# Patient Record
Sex: Female | Born: 1960 | Race: Black or African American | Hispanic: No | Marital: Single | Smoking: Current every day smoker
Health system: Southern US, Community
[De-identification: ages and names within clinical notes are randomized; demographics above are authoritative.]

## PROBLEM LIST (undated history)

## (undated) DIAGNOSIS — I10 Essential (primary) hypertension: Secondary | ICD-10-CM

## (undated) DIAGNOSIS — R51 Headache: Secondary | ICD-10-CM

## (undated) HISTORY — PX: TONSILLECTOMY: SUR1361

---

## 2011-09-21 ENCOUNTER — Encounter: Payer: Self-pay | Admitting: Emergency Medicine

## 2011-09-21 ENCOUNTER — Other Ambulatory Visit: Payer: Self-pay

## 2011-09-21 ENCOUNTER — Emergency Department (INDEPENDENT_AMBULATORY_CARE_PROVIDER_SITE_OTHER): Payer: Self-pay

## 2011-09-21 ENCOUNTER — Observation Stay (HOSPITAL_BASED_OUTPATIENT_CLINIC_OR_DEPARTMENT_OTHER)
Admission: EM | Admit: 2011-09-21 | Discharge: 2011-09-23 | Disposition: A | Discharge status: Home / Self Care | Payer: Self-pay | Attending: Internal Medicine | Admitting: Internal Medicine

## 2011-09-21 DIAGNOSIS — Z23 Encounter for immunization: Secondary | ICD-10-CM | POA: Insufficient documentation

## 2011-09-21 DIAGNOSIS — E785 Hyperlipidemia, unspecified: Secondary | ICD-10-CM | POA: Insufficient documentation

## 2011-09-21 DIAGNOSIS — I161 Hypertensive emergency: Secondary | ICD-10-CM

## 2011-09-21 DIAGNOSIS — F17200 Nicotine dependence, unspecified, uncomplicated: Secondary | ICD-10-CM | POA: Insufficient documentation

## 2011-09-21 DIAGNOSIS — I10 Essential (primary) hypertension: Principal | ICD-10-CM | POA: Insufficient documentation

## 2011-09-21 DIAGNOSIS — Z720 Tobacco use: Secondary | ICD-10-CM | POA: Diagnosis present

## 2011-09-21 HISTORY — DX: Headache: R51

## 2011-09-21 LAB — CBC
HCT: 43.6 % (ref 36.0–46.0)
Hemoglobin: 14.2 g/dL (ref 12.0–15.0)
WBC: 6.8 10*3/uL (ref 4.0–10.5)

## 2011-09-21 LAB — URINE MICROSCOPIC-ADD ON

## 2011-09-21 LAB — URINALYSIS, ROUTINE W REFLEX MICROSCOPIC
Glucose, UA: NEGATIVE mg/dL
Hgb urine dipstick: NEGATIVE
Specific Gravity, Urine: 1.007 (ref 1.005–1.030)
pH: 7 (ref 5.0–8.0)

## 2011-09-21 LAB — BASIC METABOLIC PANEL
BUN: 11 mg/dL (ref 6–23)
Chloride: 107 mEq/L (ref 96–112)
GFR calc Af Amer: >90 mL/min (ref >90)
Potassium: 3.8 mEq/L (ref 3.5–5.1)

## 2011-09-21 LAB — CARDIAC PANEL(CRET KIN+CKTOT+MB+TROPI): Troponin I: <0.3 ng/mL (ref <0.30)

## 2011-09-21 MED ORDER — LISINOPRIL 40 MG PO TABS
40.0000 mg | ORAL_TABLET | Freq: Every day | ORAL | Status: DC
  Administered 2011-09-22 – 2011-09-23 (×2): 40 mg via ORAL
  Filled 2011-09-21 (×2): qty 1

## 2011-09-21 MED ORDER — ENOXAPARIN SODIUM 40 MG/0.4ML ~~LOC~~ SOLN
40.0000 mg | SUBCUTANEOUS | Status: DC
  Administered 2011-09-22 – 2011-09-23 (×2): 40 mg via SUBCUTANEOUS
  Filled 2011-09-21 (×2): qty 0.4

## 2011-09-21 MED ORDER — ACETAMINOPHEN 650 MG RE SUPP: 650.0000 mg | Freq: Four times a day (QID) | RECTAL | Status: DC | PRN

## 2011-09-21 MED ORDER — ASPIRIN EC 81 MG PO TBEC
81.0000 mg | DELAYED_RELEASE_TABLET | Freq: Every day | ORAL | Status: DC
  Administered 2011-09-22 – 2011-09-23 (×2): 81 mg via ORAL
  Filled 2011-09-21 (×2): qty 1

## 2011-09-21 MED ORDER — LABETALOL HCL 5 MG/ML IV SOLN
10.0000 mg | INTRAVENOUS | Status: DC | PRN
  Filled 2011-09-21: qty 4

## 2011-09-21 MED ORDER — PNEUMOCOCCAL VAC POLYVALENT 25 MCG/0.5ML IJ INJ
0.5000 mL | INJECTION | INTRAMUSCULAR | Status: AC
  Administered 2011-09-22: 0.5 mL via INTRAMUSCULAR
  Filled 2011-09-21: qty 0.5

## 2011-09-21 MED ORDER — ACETAMINOPHEN 325 MG PO TABS
650.0000 mg | ORAL_TABLET | Freq: Four times a day (QID) | ORAL | Status: DC | PRN
  Administered 2011-09-22 – 2011-09-23 (×2): 650 mg via ORAL
  Filled 2011-09-21 (×2): qty 2

## 2011-09-21 MED ORDER — AMLODIPINE BESYLATE 10 MG PO TABS
10.0000 mg | ORAL_TABLET | Freq: Every day | ORAL | Status: DC
  Administered 2011-09-22 – 2011-09-23 (×2): 10 mg via ORAL
  Filled 2011-09-21 (×2): qty 1

## 2011-09-21 MED ORDER — ONDANSETRON HCL 4 MG PO TABS: 4.0000 mg | ORAL_TABLET | Freq: Four times a day (QID) | ORAL | Status: DC | PRN

## 2011-09-21 MED ORDER — SODIUM CHLORIDE 0.9 % IJ SOLN
3.0000 mL | Freq: Two times a day (BID) | INTRAMUSCULAR | Status: DC
  Administered 2011-09-22 (×2): 3 mL via INTRAVENOUS

## 2011-09-21 MED ORDER — ONDANSETRON HCL 4 MG/2ML IJ SOLN: 4.0000 mg | Freq: Four times a day (QID) | INTRAMUSCULAR | Status: DC | PRN

## 2011-09-21 MED ORDER — LABETALOL HCL 5 MG/ML IV SOLN
10.0000 mg | Freq: Once | INTRAVENOUS | Status: AC
  Administered 2011-09-21: 10 mg via INTRAVENOUS
  Filled 2011-09-21: qty 4

## 2011-09-21 MED ORDER — HYDRALAZINE HCL 20 MG/ML IJ SOLN
10.0000 mg | INTRAMUSCULAR | Status: AC
  Administered 2011-09-21: 10 mg via INTRAVENOUS
  Filled 2011-09-21: qty 1

## 2011-09-21 MED ORDER — SODIUM CHLORIDE 0.9 % IV SOLN: 250.0000 mL | INTRAVENOUS | Status: DC | PRN

## 2011-09-21 MED ORDER — SODIUM CHLORIDE 0.9 % IJ SOLN: 3.0000 mL | INTRAMUSCULAR | Status: DC | PRN

## 2011-09-21 NOTE — ED Notes (Signed)
Pt reports she had high BP at MD office yesterday (was there for a recheck of her foot); BP was 207/117; was not told to follow up, but she wanted to f/u today.

## 2011-09-21 NOTE — ED Provider Notes (Addendum)
History     CSN: [id number]  Arrival date & time 09/21/11  1032   First MD Initiated Contact with Patient 09/21/11 1111      No chief complaint on file. CC:  HTN  Pleasant, 51 year old female, who states she does not have a primary care Dr. She also denies any past medical history. She states she injured her foot at work 2 weeks ago. Was seen by an urgent care center. She was told her blood pressure was elevated at that time, but did not receive any prescriptions. Patient was seen again yesterday for a two-week followup of the foot injury and was again told her blood pressure was elevated. She was referred to see a primary care physician, but was not given any prescription for blood pressure medications. This morning. Patient states she got worried and wanted to be rechecked, so she came in to the ED for evaluation. Initial blood pressure is 250/136 and triaged. Patient is essentially asymptomatic. Denies headache or visual changes or any numbness, weakness or tingling. Denies any chest pain or shortness of breath. Denies any nausea, vomiting, or abdominal pain. Denies any back pain, or edema of the extremities. Patient does smoke but does not use any drugs, alcohol.  Apparently, hypertension does run in the family. She denies any recent hospital admissions  (Consider location/radiation/quality/duration/timing/severity/associated sxs/prior treatment) HPI  No past medical history on file.  No past surgical history on file.  No family history on file.  History  Substance Use Topics  . Smoking status: Not on file  . Smokeless tobacco: Not on file  . Alcohol Use: Not on file    OB History    No data available      Review of Systems  All other systems reviewed and are negative.    Allergies  Review of patient's allergies indicates not on file.  Home Medications  No current outpatient prescriptions on file.  BP 250/136  Pulse 102  Temp(Src) 98.6 F (37 C) (Oral)  Resp 18   Ht 5\' 3"  (1.6 m)  Wt 230 lb 9.6 oz (104.599 kg)  BMI 40.85 kg/m2  SpO2 99%  Physical Exam  Nursing note and vitals reviewed. Constitutional: She appears well-developed and well-nourished. No distress.  HENT:  Head: Normocephalic.  Eyes: Pupils are equal, round, and reactive to light.  Cardiovascular: Normal heart sounds.   Pulmonary/Chest: Breath sounds normal.  Abdominal: Soft.  Musculoskeletal: Normal range of motion. She exhibits no edema.  Neurological: She is alert.  Skin: Skin is warm and dry.    ED Course  Procedures (including critical care time)  Labs Reviewed - No data to display No results found.   No diagnosis found.    MDM  Pt is seen and examined;  Initial history and physical completed.  Will follow.          Amilyah Nack A. Patrica Duel, MD 09/22/11 1308  Lorelle Gibbs. Patrica Duel, MD 10/17/11 1102

## 2011-09-22 ENCOUNTER — Encounter (HOSPITAL_COMMUNITY): Payer: Self-pay

## 2011-09-22 LAB — CBC
HCT: 41.9 % (ref 36.0–46.0)
HCT: 42.1 % (ref 36.0–46.0)
Hemoglobin: 13.8 g/dL (ref 12.0–15.0)
MCH: 26.3 pg (ref 26.0–34.0)
MCV: 79.3 fL (ref 78.0–100.0)
MCV: 79.8 fL (ref 78.0–100.0)
Platelets: 201 10*3/uL (ref 150–400)
Platelets: 206 10*3/uL (ref 150–400)
RBC: 5.25 MIL/uL — ABNORMAL HIGH (ref 3.87–5.11)
RBC: 5.31 MIL/uL — ABNORMAL HIGH (ref 3.87–5.11)
WBC: 6.7 10*3/uL (ref 4.0–10.5)

## 2011-09-22 LAB — COMPREHENSIVE METABOLIC PANEL
AST: 15 U/L (ref 0–37)
Albumin: 3.3 g/dL — ABNORMAL LOW (ref 3.5–5.2)
Chloride: 108 mEq/L (ref 96–112)
Creatinine, Ser: 0.69 mg/dL (ref 0.50–1.10)
Total Bilirubin: 0.2 mg/dL — ABNORMAL LOW (ref 0.3–1.2)
Total Protein: 6.9 g/dL (ref 6.0–8.3)

## 2011-09-22 LAB — TSH: TSH: 4.473 u[IU]/mL (ref 0.350–4.500)

## 2011-09-22 LAB — CREATININE, SERUM: GFR calc Af Amer: >90 mL/min (ref >90)

## 2011-09-22 MED ORDER — HYDROCHLOROTHIAZIDE 12.5 MG PO CAPS
12.5000 mg | ORAL_CAPSULE | Freq: Every day | ORAL | Status: DC
  Administered 2011-09-22 – 2011-09-23 (×2): 12.5 mg via ORAL
  Filled 2011-09-22 (×2): qty 1

## 2011-09-22 NOTE — Progress Notes (Signed)
PATIENT DETAILS Name: Michele Adams Age: 51 y.o. Sex: female Date of Birth: [birth year] Admit Date: 09/21/2011 PCP:No primary provider on file.  Subjective: Feels a lot better  Objective: Vital signs in last 24 hours: Filed Vitals:   09/22/11 0014 09/22/11 0518 09/22/11 0900 09/22/11 1300  BP: 165/95 170/83 156/93 174/101  Pulse: 76 77 74 77  Temp:  98.3 F (36.8 C) 98.3 F (36.8 C) 97.5 F (36.4 C)  TempSrc:  Oral Oral Oral  Resp:  18 19 20   Height:      Weight:      SpO2:  94% 98% 97%    Weight change:   Body mass index is 40.65 kg/(m^2).  Intake/Output from previous day:  Intake/Output Summary (Last 24 hours) at 09/22/11 1647 Last data filed at 09/22/11 1300  Gross per 24 hour  Intake    720 ml  Output      0 ml  Net    720 ml    PHYSICAL EXAM: Gen Exam: Awake and alert with clear speech.   Neck: Supple, No JVD.   Chest: B/L Clear.   CVS: S1 S2 Regular, no murmurs.  Abdomen: soft, BS +, non tender, non distended.  Extremities: no edema, lower extremities warm to touch. Neurologic: Non Focal.   Skin: No Rash.   Wounds: N/A.    CONSULTS:  none  LAB RESULTS: CBC  Lab 09/22/11 0600 09/22/11 0007 09/21/11 1100  WBC 6.3 6.7 6.8  HGB 13.8 13.6 14.2  HCT 41.9 42.1 43.6  PLT 201 206 203  MCV 79.8 79.3 77.3*  MCH 26.3 25.6* 25.2*  MCHC 32.9 32.3 32.6  RDW 14.8 14.8 15.0  LYMPHSABS -- -- --  MONOABS -- -- --  EOSABS -- -- --  BASOSABS -- -- --  BANDABS -- -- --    Chemistries   Lab 09/22/11 0600 09/22/11 0007 09/21/11 1100  NA 143 -- 143  K 4.1 -- 3.8  CL 108 -- 107  CO2 25 -- 26  GLUCOSE 92 -- 93  BUN 13 -- 11  CREATININE 0.69 0.65 0.70  CALCIUM 9.4 -- 9.7  MG -- -- --    GFR Estimated Creatinine Clearance: 93.8 ml/min (by C-G formula based on Cr of 0.69).  Coagulation profile No results found for this basename: INR:5,PROTIME:5 in the last 168 hours  Cardiac Enzymes  Lab 09/21/11 1100  CKMB 3.3  TROPONINI <0.30  MYOGLOBIN --      No components found with this basename: POCBNP:3 No results found for this basename: DDIMER:2 in the last 72 hours No results found for this basename: HGBA1C:2 in the last 72 hours No results found for this basename: CHOL:2,HDL:2,LDLCALC:2,TRIG:2,CHOLHDL:2,LDLDIRECT:2 in the last 72 hours  Basename 09/22/11 0007  TSH 4.473  T4TOTAL --  T3FREE --  THYROIDAB --   No results found for this basename: VITAMINB12:2,FOLATE:2,FERRITIN:2,TIBC:2,IRON:2,RETICCTPCT:2 in the last 72 hours No results found for this basename: LIPASE:2,AMYLASE:2 in the last 72 hours  Urine Studies No results found for this basename: UACOL:2,UAPR:2,USPG:2,UPH:2,UTP:2,UGL:2,UKET:2,UBIL:2,UHGB:2,UNIT:2,UROB:2,ULEU:2,UEPI:2,UWBC:2,URBC:2,UBAC:2,CAST:2,CRYS:2,UCOM:2,BILUA:2 in the last 72 hours  MICROBIOLOGY: No results found for this or any previous visit (from the past 240 hour(s)).  RADIOLOGY STUDIES/RESULTS: Dg Chest 2 View  09/21/2011  *RADIOLOGY REPORT*  Clinical Data: Hypertension  CHEST - 2 VIEW  Comparison: None.  Findings: The cardiac silhouette, mediastinum, pulmonary vasculature are within normal limits.  Both lungs are clear. There is no acute bony abnormality.  IMPRESSION: There is no evidence of acute cardiac or pulmonary process.  Original Report  Authenticated By: Brandon Melnick, M.D.    MEDICATIONS: Scheduled Meds:   . amLODipine  10 mg Oral Daily  . aspirin EC  81 mg Oral Daily  . enoxaparin  40 mg Subcutaneous Q24H  . hydrochlorothiazide  12.5 mg Oral Daily  . lisinopril  40 mg Oral Daily  . pneumococcal 23 valent vaccine  0.5 mL Intramuscular Tomorrow-1000  . DISCONTD: sodium chloride  3 mL Intravenous Q12H   Continuous Infusions:  PRN Meds:.acetaminophen, acetaminophen, labetalol, ondansetron (ZOFRAN) IV, ondansetron, DISCONTD: sodium chloride, DISCONTD: sodium chloride  Antibiotics: Anti-infectives    None      Assessment/Plan: Patient Active Hospital Problem List: Hypertensive  urgency    Assessment: Better controlled today    Plan: Continue with amlodipine and lisinopril, will add hydrochlorothiazide.  Tobacco abuse    Assessment: Have counseled extensively    Plan : Counseling  Disposition: Remain inpatient-discharge home tomorrow  DVT Prophylaxis: Subcutaneous Lovenox  Code Status: Full code  Maretta Bees,  MD. 09/22/2011, 4:47 PM

## 2011-09-22 NOTE — H&P (Signed)
Michele Adams is an 51 y.o. female.   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 No significant symptoms but initial attempt to control BP with hydralazine and Labetalol failed so patient was admitted for further control and titration of medications. Has strong family history for HTN. No CP, no dizziness, no headache now although on and off she has had it.   History reviewed. No pertinent past medical history.  History reviewed. No pertinent past surgical history.  Family History  Problem Relation Age of Onset  . Malignant hyperthermia Mother    Social History:  reports that she has been smoking.  She does not have any smokeless tobacco history on file. She reports that she does not drink alcohol or use illicit drugs.  Allergies: No Known Allergies  Medications Prior to Admission  Medication Dose Route Frequency Provider Last Rate Last Dose  . 0.9 %  sodium chloride infusion  250 mL Intravenous PRN Lawal Garba      . acetaminophen (TYLENOL) tablet 650 mg  650 mg Oral Q6H PRN Lawal Garba   650 mg at 09/22/11 0018   Or  . acetaminophen (TYLENOL) suppository 650 mg  650 mg Rectal Q6H PRN Lawal Garba      . amLODipine (NORVASC) tablet 10 mg  10 mg Oral Daily Lawal Garba      . aspirin EC tablet 81 mg  81 mg Oral Daily Lawal Garba      . enoxaparin (LOVENOX) injection 40 mg  40 mg Subcutaneous Q24H Lawal Garba      . hydrALAZINE (APRESOLINE) injection 10 mg  10 mg Intravenous STAT Peter A. Tucich, MD   10 mg at 09/21/11 1416  . labetalol (NORMODYNE,TRANDATE) injection 10 mg  10 mg Intravenous Once Peter A. Patrica Duel, MD   10 mg at 09/21/11 1133  . labetalol (NORMODYNE,TRANDATE) injection 10 mg  10 mg Intravenous Q2H PRN Lawal Garba      . lisinopril (PRINIVIL,ZESTRIL) tablet 40 mg  40 mg Oral Daily Lawal Garba      . ondansetron (ZOFRAN) tablet 4 mg  4 mg Oral  Q6H PRN Lawal Garba       Or  . ondansetron (ZOFRAN) injection 4 mg  4 mg Intravenous Q6H PRN Lawal Garba      . pneumococcal 23 valent vaccine (PNU-IMMUNE) injection 0.5 mL  0.5 mL Intramuscular Tomorrow-1000 Evelina Kartsimaris      . sodium chloride 0.9 % injection 3 mL  3 mL Intravenous Q12H Lawal Garba   3 mL at 09/22/11 0019  . sodium chloride 0.9 % injection 3 mL  3 mL Intravenous PRN Lawal Garba       No current outpatient prescriptions on file as of 09/22/2011.    Results for orders placed during the hospital encounter of 09/21/11 (from the past 48 hour(s))  CARDIAC PANEL(CRET KIN+CKTOT+MB+TROPI)     Status: Abnormal   Collection Time   09/21/11 11:00 AM      Component Value Range Comment   Total CK 260 (*) 7 - 177 (U/L)    CK, MB 3.3  0.3 - 4.0 (ng/mL)    Troponin I <0.30  <0.30 (ng/mL)    Relative Index 1.3  0.0 - 2.5    BASIC METABOLIC PANEL     Status: Normal   Collection Time   09/21/11 11:00 AM      Component Value Range  Comment   Sodium 143  135 - 145 (mEq/L)    Potassium 3.8  3.5 - 5.1 (mEq/L)    Chloride 107  96 - 112 (mEq/L)    CO2 26  19 - 32 (mEq/L)    Glucose, Bld 93  70 - 99 (mg/dL)    BUN 11  6 - 23 (mg/dL)    Creatinine, Ser 1.61  0.50 - 1.10 (mg/dL)    Calcium 9.7  8.4 - 10.5 (mg/dL)    GFR calc non Af Amer >90  >90 (mL/min)    GFR calc Af Amer >90  >90 (mL/min)   CBC     Status: Abnormal   Collection Time   09/21/11 11:00 AM      Component Value Range Comment   WBC 6.8  4.0 - 10.5 (K/uL)    RBC 5.64 (*) 3.87 - 5.11 (MIL/uL)    Hemoglobin 14.2  12.0 - 15.0 (g/dL)    HCT 09.6  04.5 - 40.9 (%)    MCV 77.3 (*) 78.0 - 100.0 (fL)    MCH 25.2 (*) 26.0 - 34.0 (pg)    MCHC 32.6  30.0 - 36.0 (g/dL)    RDW 81.1  91.4 - 78.2 (%)    Platelets 203  150 - 400 (K/uL)   URINALYSIS, ROUTINE W REFLEX MICROSCOPIC     Status: Abnormal   Collection Time   09/21/11 11:20 AM      Component Value Range Comment   Color, Urine YELLOW  YELLOW     APPearance CLEAR  CLEAR      Specific Gravity, Urine 1.007  1.005 - 1.030     pH 7.0  5.0 - 8.0     Glucose, UA NEGATIVE  NEGATIVE (mg/dL)    Hgb urine dipstick NEGATIVE  NEGATIVE     Bilirubin Urine NEGATIVE  NEGATIVE     Ketones, ur NEGATIVE  NEGATIVE (mg/dL)    Protein, ur NEGATIVE  NEGATIVE (mg/dL)    Urobilinogen, UA 0.2  0.0 - 1.0 (mg/dL)    Nitrite NEGATIVE  NEGATIVE     Leukocytes, UA TRACE (*) NEGATIVE    URINE MICROSCOPIC-ADD ON     Status: Normal   Collection Time   09/21/11 11:20 AM      Component Value Range Comment   Squamous Epithelial / LPF RARE  RARE     WBC, UA 3-6  <3 (WBC/hpf)    Bacteria, UA RARE  RARE     Urine-Other MUCOUS PRESENT   TRICHOMONAS PRESENT  CBC     Status: Abnormal   Collection Time   09/22/11 12:07 AM      Component Value Range Comment   WBC 6.7  4.0 - 10.5 (K/uL)    RBC 5.31 (*) 3.87 - 5.11 (MIL/uL)    Hemoglobin 13.6  12.0 - 15.0 (g/dL)    HCT 95.6  21.3 - 08.6 (%)    MCV 79.3  78.0 - 100.0 (fL)    MCH 25.6 (*) 26.0 - 34.0 (pg)    MCHC 32.3  30.0 - 36.0 (g/dL)    RDW 57.8  46.9 - 62.9 (%)    Platelets 206  150 - 400 (K/uL)   CREATININE, SERUM     Status: Normal   Collection Time   09/22/11 12:07 AM      Component Value Range Comment   Creatinine, Ser 0.65  0.50 - 1.10 (mg/dL)    GFR calc non Af Amer >90  >90 (mL/min)  GFR calc Af Amer >90  >90 (mL/min)    Dg Chest 2 View  09/21/2011  *RADIOLOGY REPORT*  Clinical Data: Hypertension  CHEST - 2 VIEW  Comparison: None.  Findings: The cardiac silhouette, mediastinum, pulmonary vasculature are within normal limits.  Both lungs are clear. There is no acute bony abnormality.  IMPRESSION: There is no evidence of acute cardiac or pulmonary process.  Original Report Authenticated By: Brandon Melnick, M.D.    Review of Systems  Constitutional: Negative.   HENT: Negative for hearing loss and tinnitus.   Eyes: Negative.   Respiratory: Negative.   Cardiovascular: Negative.   Gastrointestinal: Negative.   Genitourinary:  Negative.   Musculoskeletal: Negative.   Skin: Negative.   Neurological: Positive for headaches.  Endo/Heme/Allergies: Negative.   Psychiatric/Behavioral: Negative.     Blood pressure 165/95, pulse 76, temperature 98.5 F (36.9 C), temperature source Oral, resp. rate 18, height 5\' 3"  (1.6 m), weight 104.1 kg (229 lb 8 oz), SpO2 96.00%. Physical Exam  Constitutional: She is oriented to person, place, and time. She appears well-nourished.  HENT:  Head: Normocephalic and atraumatic.  Right Ear: External ear normal.  Left Ear: External ear normal.  Mouth/Throat: Oropharynx is clear and moist.  Eyes: Conjunctivae and EOM are normal. Pupils are equal, round, and reactive to light.  Neck: Normal range of motion. Neck supple.  Cardiovascular: Normal rate, regular rhythm, normal heart sounds and intact distal pulses.   Respiratory: Effort normal and breath sounds normal.  GI: Soft. Bowel sounds are normal.  Musculoskeletal: Normal range of motion.  Neurological: She is alert and oriented to person, place, and time. She has normal reflexes.  Skin: Skin is warm and dry.  Psychiatric: She has a normal mood and affect. Her behavior is normal. Judgment and thought content normal.     Assessment/Plan 1. Hypertensive Emergency: Improving. Will initiate oral medications - Lisinopril and Amlodipine and have PRN Labetalol. Once blood pressure is controlled, DC on oral medications. 2. Tobacco abuse: Counseled  GARBA,LAWAL 09/22/2011, 4:57 AM

## 2011-09-23 LAB — LIPID PANEL
LDL Cholesterol: 145 mg/dL — ABNORMAL HIGH (ref 0–99)
Total CHOL/HDL Ratio: 4 RATIO
VLDL: 27 mg/dL (ref 0–40)

## 2011-09-23 MED ORDER — ASPIRIN 81 MG PO TBEC: 81.0000 mg | DELAYED_RELEASE_TABLET | Freq: Every day | ORAL | Status: AC

## 2011-09-23 MED ORDER — HYDROCHLOROTHIAZIDE 12.5 MG PO CAPS: 12.5000 mg | ORAL_CAPSULE | Freq: Every day | ORAL | Status: DC

## 2011-09-23 MED ORDER — SIMVASTATIN 10 MG PO TABS: 10.0000 mg | ORAL_TABLET | Freq: Every day | ORAL | Status: DC

## 2011-09-23 MED ORDER — LISINOPRIL 40 MG PO TABS: 40.0000 mg | ORAL_TABLET | Freq: Every day | ORAL | Status: DC

## 2011-09-23 MED ORDER — AMLODIPINE BESYLATE 10 MG PO TABS: 10.0000 mg | ORAL_TABLET | Freq: Every day | ORAL | Status: DC

## 2011-09-23 MED ORDER — SIMVASTATIN 10 MG PO TABS
10.0000 mg | ORAL_TABLET | Freq: Every day | ORAL | Status: DC
  Filled 2011-09-23: qty 1

## 2011-09-23 NOTE — Discharge Summary (Signed)
PATIENT DETAILS Name: Michele Adams Age: 51 y.o. Sex: female Date of Birth: 01/02/1961 MRN: [id number]. Admit Date: 09/21/2011 Admitting Physician: Lonia Blood PCP:No primary provider on file.  PRIMARY DISCHARGE DIAGNOSIS:  Principal Problem:  *Hypertensive Urgency  Dyslipidemia  Active Problems:  Tobacco abuse      PAST MEDICAL HISTORY: Past Medical History  Diagnosis Date  . Headache     DISCHARGE MEDICATIONS: Current Discharge Medication List    START taking these medications   Details  amLODipine (NORVASC) 10 MG tablet Take 1 tablet (10 mg total) by mouth daily. Qty: 30 tablet, Refills: 0    aspirin EC 81 MG EC tablet Take 1 tablet (81 mg total) by mouth daily.    hydrochlorothiazide (MICROZIDE) 12.5 MG capsule Take 1 capsule (12.5 mg total) by mouth daily. Qty: 30 capsule, Refills: 0    lisinopril (PRINIVIL,ZESTRIL) 40 MG tablet Take 1 tablet (40 mg total) by mouth daily. Qty: 30 tablet, Refills: 0    simvastatin (ZOCOR) 10 MG tablet Take 1 tablet (10 mg total) by mouth daily at 6 PM. Qty: 30 tablet, Refills: 0         BRIEF HPI:  See H&P, Labs, Consult and Test reports for all details in brief, patient was admitted for uncontrolled hypertension. For further details please see the history and physical done on admission.  CONSULTATIONS:   none  PERTINENT RADIOLOGIC STUDIES: Dg Chest 2 View  09/21/2011  *RADIOLOGY REPORT*  Clinical Data: Hypertension  CHEST - 2 VIEW  Comparison: None.  Findings: The cardiac silhouette, mediastinum, pulmonary vasculature are within normal limits.  Both lungs are clear. There is no acute bony abnormality.  IMPRESSION: There is no evidence of acute cardiac or pulmonary process.  Original Report Authenticated By: Brandon Melnick, M.D.     PERTINENT LAB RESULTS: CBC:  Basename 09/22/11 0600 09/22/11 0007  WBC 6.3 6.7  HGB 13.8 13.6  HCT 41.9 42.1  PLT 201 206   CMET CMP     Component Value Date/Time   NA 143 09/22/2011  0600   K 4.1 09/22/2011 0600   CL 108 09/22/2011 0600   CO2 25 09/22/2011 0600   GLUCOSE 92 09/22/2011 0600   BUN 13 09/22/2011 0600   CREATININE 0.69 09/22/2011 0600   CALCIUM 9.4 09/22/2011 0600   PROT 6.9 09/22/2011 0600   ALBUMIN 3.3* 09/22/2011 0600   AST 15 09/22/2011 0600   ALT 10 09/22/2011 0600   ALKPHOS 71 09/22/2011 0600   BILITOT 0.2* 09/22/2011 0600   GFRNONAA >90 09/22/2011 0600   GFRAA >90 09/22/2011 0600    GFR Estimated Creatinine Clearance: 93.3 ml/min (by C-G formula based on Cr of 0.69). No results found for this basename: LIPASE:2,AMYLASE:2 in the last 72 hours  Basename 09/21/11 1100  CKTOTAL 260*  CKMB 3.3  CKMBINDEX --  TROPONINI <0.30   No components found with this basename: POCBNP:3 No results found for this basename: DDIMER:2 in the last 72 hours No results found for this basename: HGBA1C:2 in the last 72 hours  Basename 09/23/11 0635  CHOL 230*  HDL 58  LDLCALC 145*  TRIG 136  CHOLHDL 4.0  LDLDIRECT --    Basename 09/22/11 0007  TSH 4.473  T4TOTAL --  T3FREE --  THYROIDAB --   No results found for this basename: VITAMINB12:2,FOLATE:2,FERRITIN:2,TIBC:2,IRON:2,RETICCTPCT:2 in the last 72 hours Coags: No results found for this basename: PT:2,INR:2 in the last 72 hours Microbiology: No results found for this or any previous visit (from the  past 240 hour(s)).   BRIEF HOSPITAL COURSE:   Principal Problem:  *Hypertensive Urgency -Patient presented with significantly elevated blood pressures, upon admission she started on antihypertensive medications. She is now been placed on amlodipine and hydrochlorothiazide with relatively well blood pressure control. She will be discharged home today, she claims she will followup with a primary care Dr. at that her mother she sees for further continued care. We will defer any further workup to evaluate secondary causes of hypertension for an outpatient. This has been explained to the patient and she will pursue further testing to  be done in the outpatient setting. She claims understanding of this plan.  Active Problems: Dyslipidemia -Patient will be started on statin. Further monitoring of her lipid levels were done as an outpatient.   Tobacco abuse  -Patient has been counseled extensively about the importance of seeking further tobacco use. She claims understanding.   TODAY-DAY OF DISCHARGE:  Subjective:   Michele Adams today has no headache,no chest abdominal pain,no new weakness tingling or numbness, feels much better wants to go home today.   Objective:   Blood pressure 147/99, pulse 80, temperature 98 F (36.7 C), temperature source Oral, resp. rate 18, height 5\' 3"  (1.6 m), weight 103.2 kg (227 lb 8.2 oz), SpO2 92.00%.  Intake/Output Summary (Last 24 hours) at 09/23/11 1342 Last data filed at 09/23/11 1100  Gross per 24 hour  Intake    480 ml  Output      1 ml  Net    479 ml    Exam Awake Alert, Oriented *3, No new F.N deficits, Normal affect [place].AT,PERRAL Supple Neck,No JVD, No cervical lymphadenopathy appriciated.  Symmetrical Chest wall movement, Good air movement bilaterally, CTAB RRR,No Gallops,Rubs or new Murmurs, No Parasternal Heave +ve B.Sounds, Abd Soft, Non tender, No organomegaly appriciated, No rebound -guarding or rigidity. No Cyanosis, Clubbing or edema, No new Rash or bruise  DISPOSITION: Home  DISCHARGE INSTRUCTIONS:    Follow-up Information    Please follow up. (PCP of her choice in 2 weeks)           Total Time spent on discharge equals 45 minutes.  SignedJeoffrey Massed 09/23/2011 1:42 PM

## 2012-12-07 IMAGING — CR DG CHEST 2V
2 series · 2 of 2 positions shown · non-contrast
Comparison: None.

CLINICAL DATA: Hypertension

CHEST - 2 VIEW

[w chest pa]
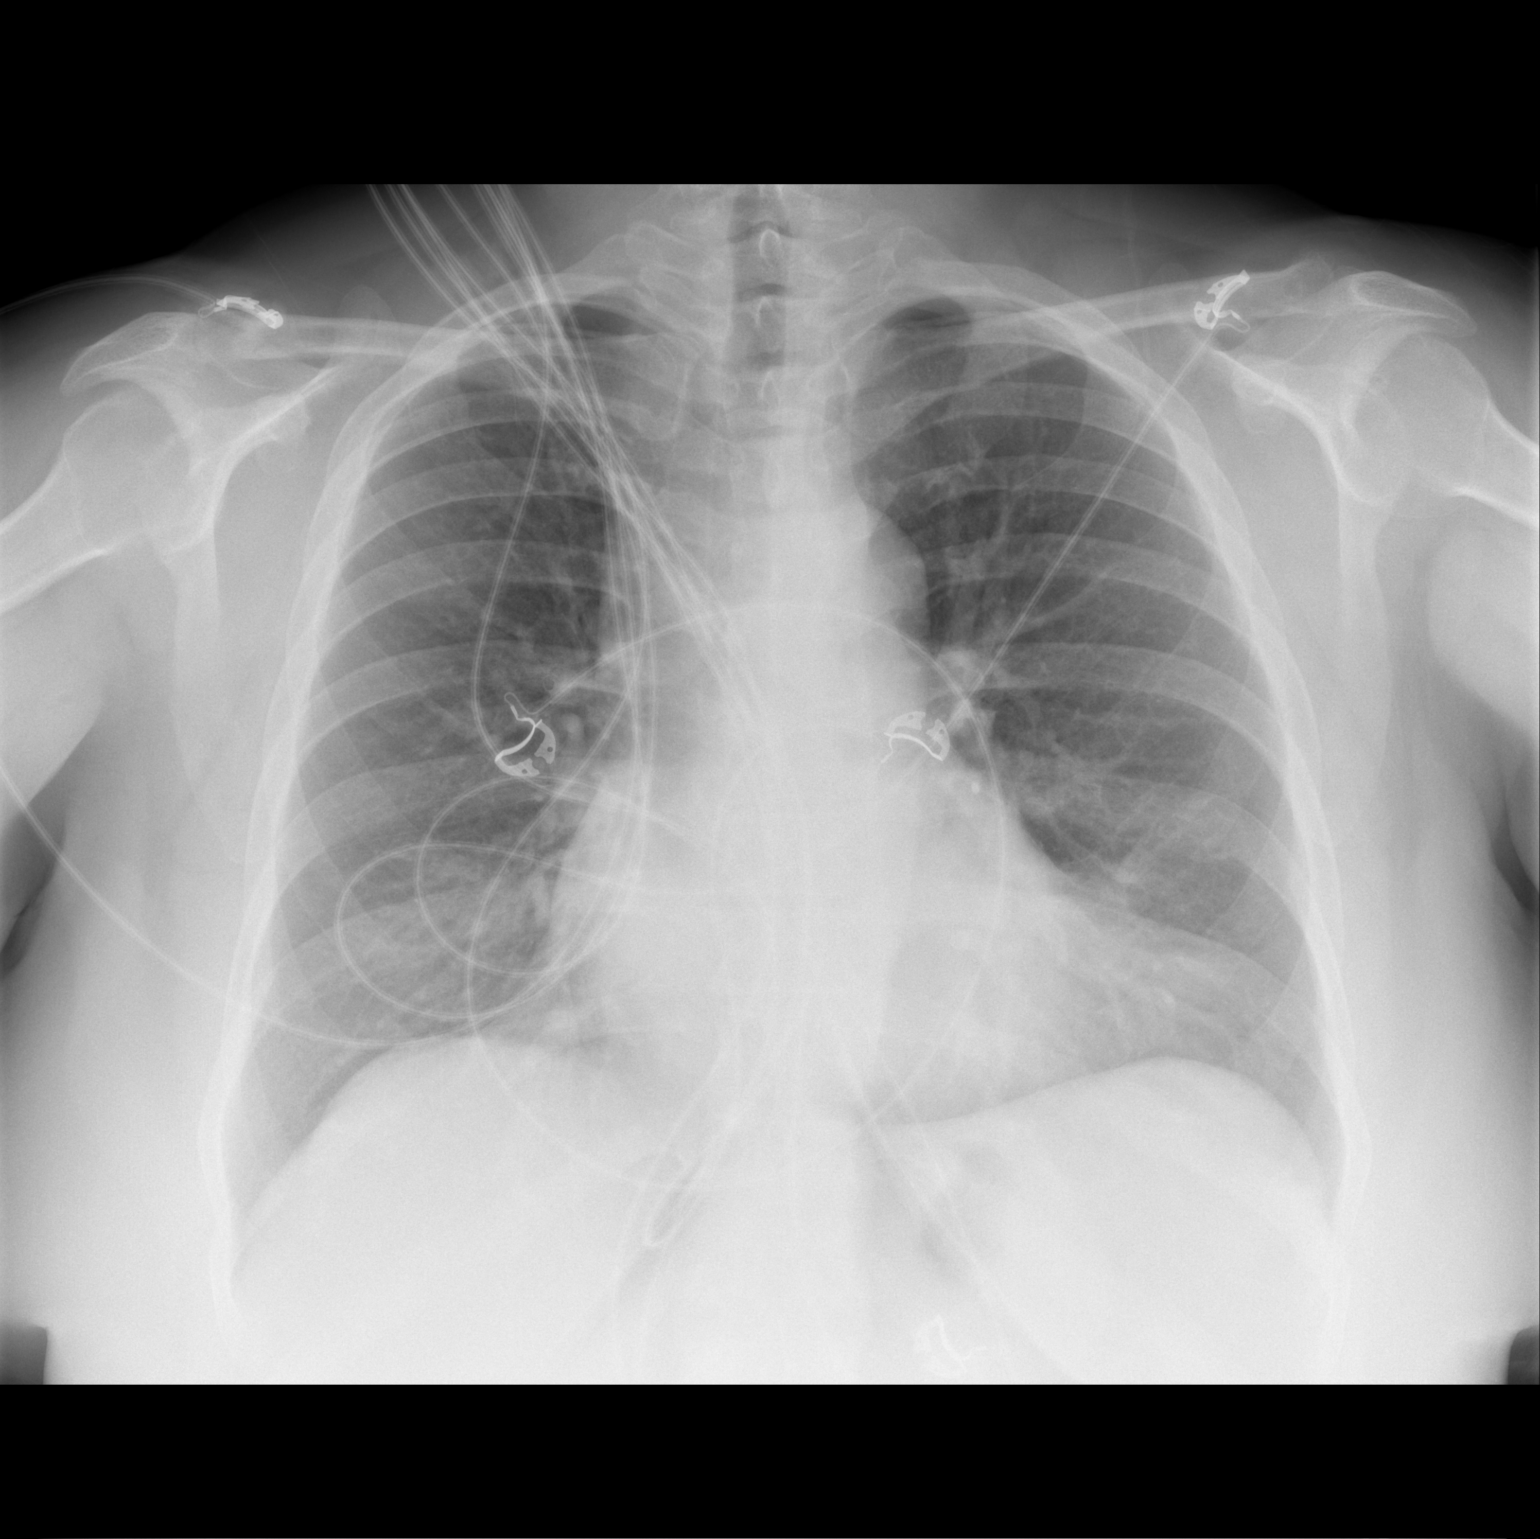

[w chest lat]
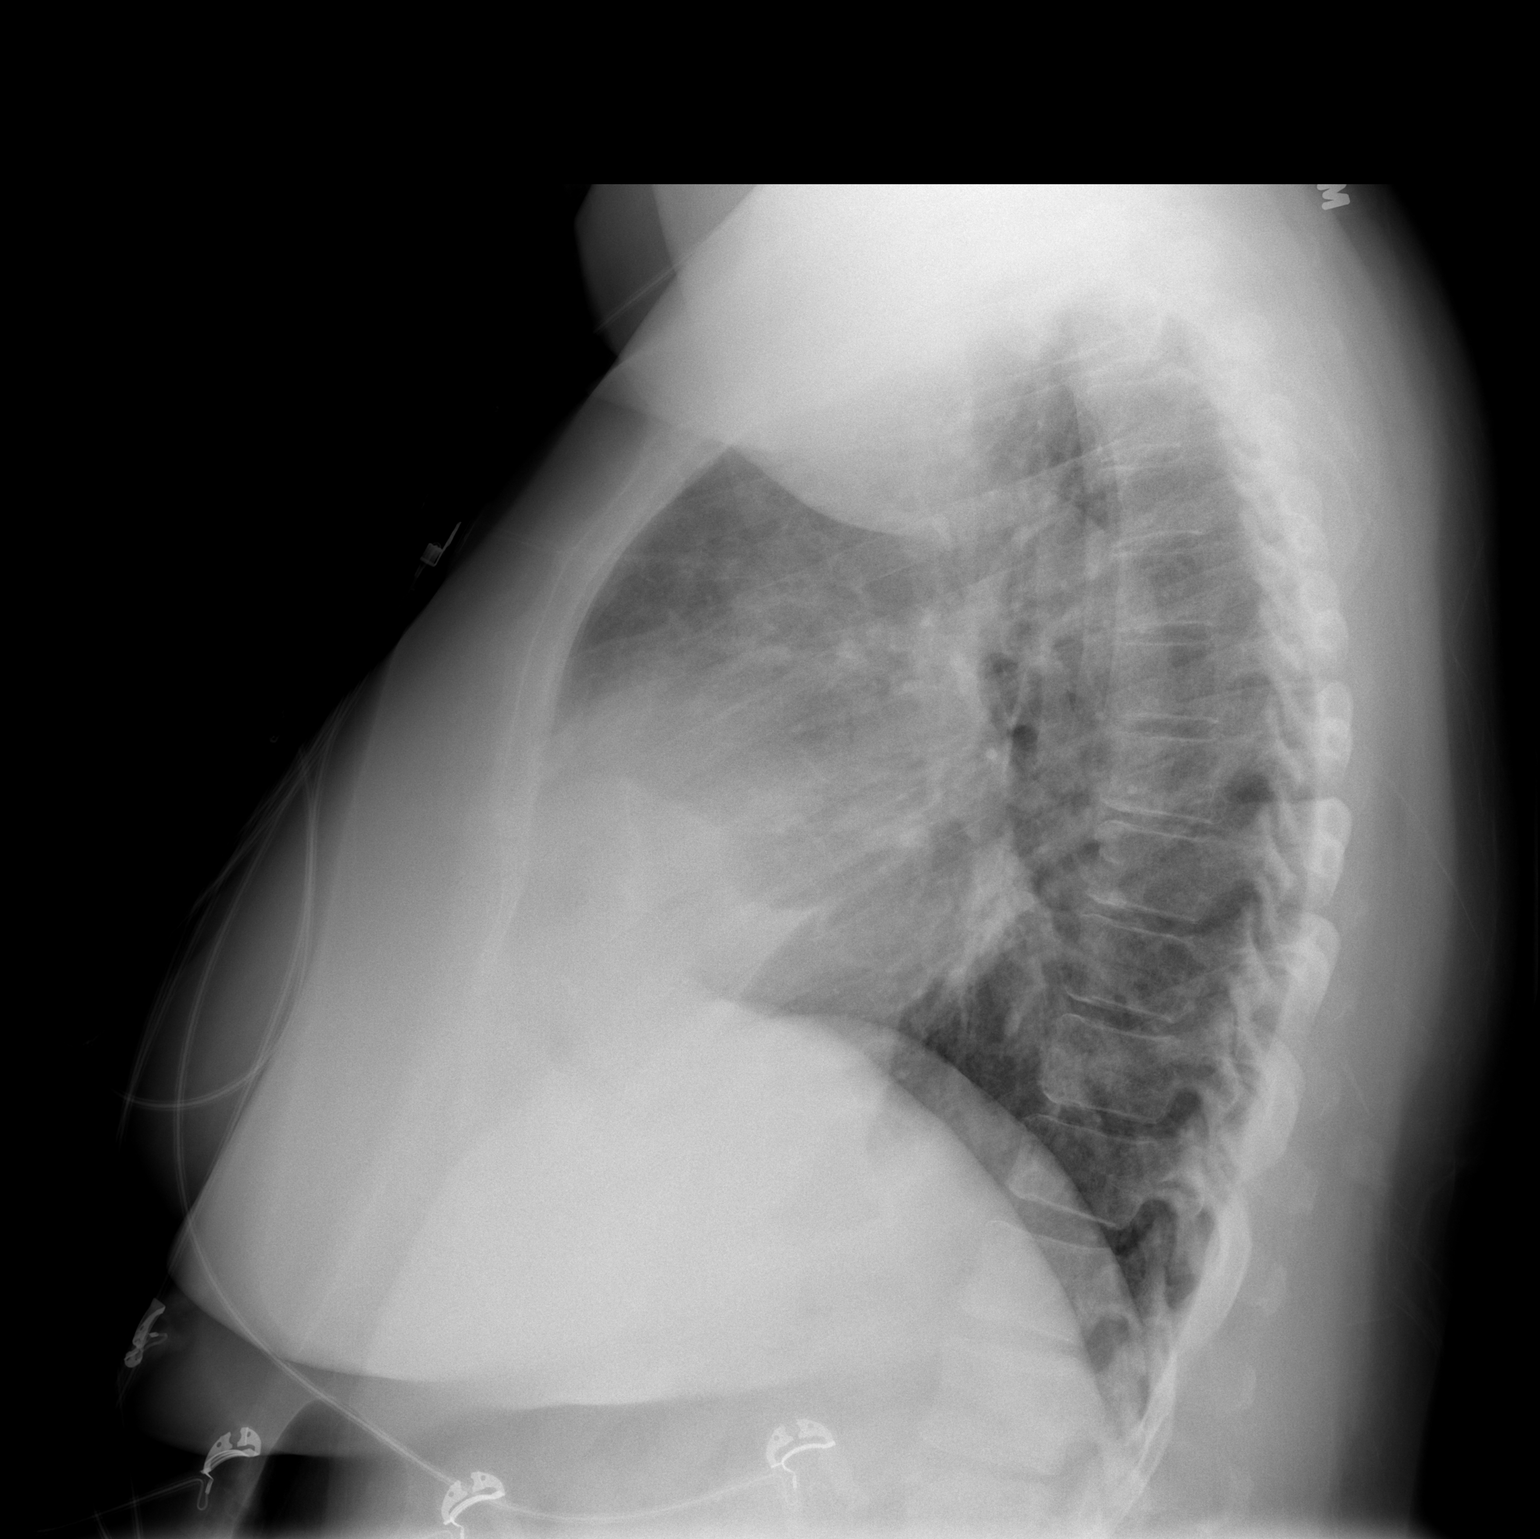

[2 of 2 positions shown; findings below may reference images not displayed]

FINDINGS: The cardiac silhouette, mediastinum, pulmonary
vasculature are within normal limits.  Both lungs are clear.
There is no acute bony abnormality.
IMPRESSION: There is no evidence of acute cardiac or pulmonary process.

## 2013-02-17 DIAGNOSIS — I10 Essential (primary) hypertension: Secondary | ICD-10-CM | POA: Insufficient documentation

## 2013-02-17 DIAGNOSIS — Z79899 Other long term (current) drug therapy: Secondary | ICD-10-CM | POA: Insufficient documentation

## 2013-02-17 DIAGNOSIS — K029 Dental caries, unspecified: Secondary | ICD-10-CM | POA: Insufficient documentation

## 2013-02-17 DIAGNOSIS — F17200 Nicotine dependence, unspecified, uncomplicated: Secondary | ICD-10-CM | POA: Insufficient documentation

## 2013-02-18 ENCOUNTER — Emergency Department (HOSPITAL_BASED_OUTPATIENT_CLINIC_OR_DEPARTMENT_OTHER)
Admission: EM | Admit: 2013-02-18 | Discharge: 2013-02-18 | Disposition: A | Discharge status: Home / Self Care | Payer: Self-pay | Attending: Emergency Medicine | Admitting: Emergency Medicine

## 2013-02-18 ENCOUNTER — Encounter (HOSPITAL_BASED_OUTPATIENT_CLINIC_OR_DEPARTMENT_OTHER): Payer: Self-pay | Admitting: Emergency Medicine

## 2013-02-18 DIAGNOSIS — K029 Dental caries, unspecified: Secondary | ICD-10-CM

## 2013-02-18 DIAGNOSIS — I10 Essential (primary) hypertension: Secondary | ICD-10-CM

## 2013-02-18 HISTORY — DX: Essential (primary) hypertension: I10

## 2013-02-18 MED ORDER — OXYCODONE-ACETAMINOPHEN 5-325 MG PO TABS: 1.0000 | ORAL_TABLET | Freq: Four times a day (QID) | ORAL | Status: DC | PRN

## 2013-02-18 MED ORDER — HYDROCHLOROTHIAZIDE 25 MG PO TABS: 25.0000 mg | ORAL_TABLET | Freq: Every day | ORAL | Status: DC

## 2013-02-18 MED ORDER — PENICILLIN V POTASSIUM 250 MG PO TABS
500.0000 mg | ORAL_TABLET | Freq: Once | ORAL | Status: AC
  Administered 2013-02-18: 500 mg via ORAL

## 2013-02-18 MED ORDER — AMLODIPINE BESYLATE 10 MG PO TABS: 10.0000 mg | ORAL_TABLET | Freq: Every day | ORAL | Status: DC

## 2013-02-18 MED ORDER — AMLODIPINE BESYLATE 5 MG PO TABS
ORAL_TABLET | ORAL | Status: AC
  Filled 2013-02-18: qty 2

## 2013-02-18 MED ORDER — HYDROCHLOROTHIAZIDE 25 MG PO TABS
ORAL_TABLET | ORAL | Status: AC
  Filled 2013-02-18: qty 1

## 2013-02-18 MED ORDER — PENICILLIN V POTASSIUM 500 MG PO TABS: 500.0000 mg | ORAL_TABLET | Freq: Four times a day (QID) | ORAL | Status: AC

## 2013-02-18 MED ORDER — HYDROCHLOROTHIAZIDE 12.5 MG PO CAPS
25.0000 mg | ORAL_CAPSULE | Freq: Every day | ORAL | Status: DC
  Administered 2013-02-18: 25 mg via ORAL
  Filled 2013-02-18: qty 2

## 2013-02-18 MED ORDER — OXYCODONE-ACETAMINOPHEN 5-325 MG PO TABS
ORAL_TABLET | ORAL | Status: AC
  Filled 2013-02-18: qty 1

## 2013-02-18 MED ORDER — OXYCODONE-ACETAMINOPHEN 5-325 MG PO TABS
1.0000 | ORAL_TABLET | Freq: Once | ORAL | Status: AC
  Administered 2013-02-18: 1 via ORAL
  Filled 2013-02-18: qty 1

## 2013-02-18 MED ORDER — PENICILLIN V POTASSIUM 250 MG PO TABS
ORAL_TABLET | ORAL | Status: AC
  Filled 2013-02-18: qty 2

## 2013-02-18 MED ORDER — AMLODIPINE BESYLATE 5 MG PO TABS
10.0000 mg | ORAL_TABLET | Freq: Once | ORAL | Status: AC
  Administered 2013-02-18: 10 mg via ORAL

## 2013-02-18 NOTE — ED Notes (Signed)
pt has hx of HTN and quit taking her blood pressure medication x 1 year ago

## 2013-02-18 NOTE — ED Provider Notes (Signed)
History     CSN: [id number]  Arrival date & time 02/17/13  2358   First MD Initiated Contact with Patient 02/18/13 0020      Chief Complaint  Patient presents with  . Dental Pain    (Consider location/radiation/quality/duration/timing/severity/associated sxs/prior treatment) Patient is a 52 y.o. female presenting with tooth pain. The history is provided by the patient.  Dental Pain Location:  Lower Lower teeth location:  24/LL central incisor Quality:  Sharp Severity:  Severe Onset quality:  Sudden Timing:  Constant Progression:  Unchanged Chronicity:  Recurrent Context: poor dentition   Context: not abscess   Relieved by:  Nothing Worsened by:  Nothing tried Associated symptoms: no congestion, no facial swelling, no fever and no neck pain   Risk factors: smoking   Also not taking BP meds for a year.  Asymptomatic from a BP perspective  Past Medical History  Diagnosis Date  . Headache(784.0)   . Hypertension     Past Surgical History  Procedure Laterality Date  . Tonsillectomy      Family History  Problem Relation Age of Onset  . Malignant hyperthermia Mother   . Hypotension Mother   . Hypotension Father   . Hypotension Sister   . Hypotension Brother     History  Substance Use Topics  . Smoking status: Current Every Day Smoker -- 1.00 packs/day for 25 years    Types: Cigarettes  . Smokeless tobacco: Never Used  . Alcohol Use: No    OB History   Grav Para Term Preterm Abortions TAB SAB Ect Mult Living                  Review of Systems  Constitutional: Negative for fever.  HENT: Negative for congestion, facial swelling, neck pain and neck stiffness.   All other systems reviewed and are negative.    Allergies  Review of patient's allergies indicates no known allergies.  Home Medications   Current Outpatient Rx  Name  Route  Sig  Dispense  Refill  . EXPIRED: amLODipine (NORVASC) 10 MG tablet   Oral   Take 1 tablet (10 mg total) by mouth  daily.   30 tablet   0   . EXPIRED: hydrochlorothiazide (MICROZIDE) 12.5 MG capsule   Oral   Take 1 capsule (12.5 mg total) by mouth daily.   30 capsule   0   . EXPIRED: lisinopril (PRINIVIL,ZESTRIL) 40 MG tablet   Oral   Take 1 tablet (40 mg total) by mouth daily.   30 tablet   0   . EXPIRED: simvastatin (ZOCOR) 10 MG tablet   Oral   Take 1 tablet (10 mg total) by mouth daily at 6 PM.   30 tablet   0     BP 232/145  Pulse 100  Temp(Src) 98.8 F (37.1 C) (Oral)  SpO2 100%  Physical Exam  Constitutional: She is oriented to person, place, and time. She appears well-developed and well-nourished. No distress.  HENT:  Head: Normocephalic and atraumatic.  Mouth/Throat: Oropharynx is clear and moist.    Eyes: Conjunctivae are normal. Pupils are equal, round, and reactive to light.  Neck: Normal range of motion. Neck supple. No tracheal deviation present.  Cardiovascular: Normal rate and regular rhythm.   Pulmonary/Chest: Effort normal and breath sounds normal. She has no wheezes. She has no rales.  Abdominal: Soft. Bowel sounds are normal. There is no tenderness.  Musculoskeletal: Normal range of motion.  Lymphadenopathy:    She has no  cervical adenopathy.  Neurological: She is alert and oriented to person, place, and time.  Skin: Skin is warm and dry.  Psychiatric: She has a normal mood and affect.    ED Course  Procedures (including critical care time)  Labs Reviewed - No data to display No results found.   No diagnosis found.    MDM  Pain medication, antibiotic and dental follow up will prescribe BP meds       Shayden Bobier K Waynetta Metheny-Rasch, MD 02/18/13 [phone]

## 2013-02-18 NOTE — ED Notes (Signed)
Pt c/o dental pain since last night.  

## 2018-04-01 ENCOUNTER — Encounter (HOSPITAL_BASED_OUTPATIENT_CLINIC_OR_DEPARTMENT_OTHER): Payer: Self-pay

## 2018-04-01 ENCOUNTER — Other Ambulatory Visit: Payer: Self-pay

## 2018-04-01 ENCOUNTER — Emergency Department (HOSPITAL_BASED_OUTPATIENT_CLINIC_OR_DEPARTMENT_OTHER)
Admission: EM | Admit: 2018-04-01 | Discharge: 2018-04-01 | Disposition: A | Discharge status: Home / Self Care | Payer: Self-pay | Attending: Emergency Medicine | Admitting: Emergency Medicine

## 2018-04-01 DIAGNOSIS — M5430 Sciatica, unspecified side: Secondary | ICD-10-CM

## 2018-04-01 DIAGNOSIS — F17210 Nicotine dependence, cigarettes, uncomplicated: Secondary | ICD-10-CM | POA: Insufficient documentation

## 2018-04-01 DIAGNOSIS — I10 Essential (primary) hypertension: Secondary | ICD-10-CM

## 2018-04-01 DIAGNOSIS — Z9114 Patient's other noncompliance with medication regimen: Secondary | ICD-10-CM

## 2018-04-01 DIAGNOSIS — Z9119 Patient's noncompliance with other medical treatment and regimen: Secondary | ICD-10-CM | POA: Insufficient documentation

## 2018-04-01 DIAGNOSIS — Z91148 Patient's other noncompliance with medication regimen for other reason: Secondary | ICD-10-CM

## 2018-04-01 DIAGNOSIS — Z79899 Other long term (current) drug therapy: Secondary | ICD-10-CM | POA: Insufficient documentation

## 2018-04-01 MED ORDER — AMLODIPINE BESYLATE 10 MG PO TABS: 10.0000 mg | ORAL_TABLET | Freq: Every day | ORAL | 0 refills | Status: AC

## 2018-04-01 MED ORDER — LISINOPRIL 40 MG PO TABS: 40.0000 mg | ORAL_TABLET | Freq: Every day | ORAL | 0 refills | Status: AC

## 2018-04-01 MED ORDER — HYDROCODONE-ACETAMINOPHEN 5-325 MG PO TABS: 1.0000 | ORAL_TABLET | ORAL | 0 refills | Status: DC | PRN

## 2018-04-01 MED ORDER — HYDROCHLOROTHIAZIDE 25 MG PO TABS: 25.0000 mg | ORAL_TABLET | Freq: Every day | ORAL | 0 refills | Status: AC

## 2018-04-01 MED ORDER — PREDNISONE 10 MG (21) PO TBPK: ORAL_TABLET | ORAL | 0 refills | Status: DC

## 2018-04-01 MED ORDER — SIMVASTATIN 10 MG PO TABS: 10.0000 mg | ORAL_TABLET | Freq: Every day | ORAL | 0 refills | Status: AC

## 2018-04-01 MED ORDER — KETOROLAC TROMETHAMINE 30 MG/ML IJ SOLN
30.0000 mg | Freq: Once | INTRAMUSCULAR | Status: AC
  Administered 2018-04-01: 30 mg via INTRAMUSCULAR
  Filled 2018-04-01: qty 1

## 2018-04-01 MED ORDER — DEXAMETHASONE SODIUM PHOSPHATE 10 MG/ML IJ SOLN
10.0000 mg | Freq: Once | INTRAMUSCULAR | Status: AC
  Administered 2018-04-01: 10 mg via INTRAMUSCULAR
  Filled 2018-04-01: qty 1

## 2018-04-01 NOTE — ED Provider Notes (Signed)
[facility] EMERGENCY DEPARTMENT Provider Note   CSN: [id number] Arrival date & time: 04/01/18  1441     History   Chief Complaint Chief Complaint  Patient presents with  . Hip Pain    HPI Michele Adams is a 57 y.o. female.  Pt presents to the ED today with left sided hip pain.  She said she woke up this morning with the pain.  It radiates down to her left buttock.  No injury.  Pt is hypertensive in triage, but has not been taking her meds due to no insurance.     Past Medical History:  Diagnosis Date  . Headache(784.0)   . Hypertension     Patient Active Problem List   Diagnosis Date Noted  . Tobacco abuse 09/21/2011  . Hypertensive emergency 09/21/2011    Past Surgical History:  Procedure Laterality Date  . TONSILLECTOMY       OB History   None      Home Medications    Prior to Admission medications   Medication Sig Start Date End Date Taking? Authorizing Provider  amLODipine (NORVASC) 10 MG tablet Take 1 tablet (10 mg total) by mouth daily. 04/01/18   Jacalyn LefevreHaviland, Mika Anastasi, MD  hydrochlorothiazide (HYDRODIURIL) 25 MG tablet Take 1 tablet (25 mg total) by mouth daily. 04/01/18   Jacalyn LefevreHaviland, Addilyne Backs, MD  HYDROcodone-acetaminophen (NORCO/VICODIN) 5-325 MG tablet Take 1 tablet by mouth every 4 (four) hours as needed. 04/01/18   Jacalyn LefevreHaviland, Marit Goodwill, MD  lisinopril (PRINIVIL,ZESTRIL) 40 MG tablet Take 1 tablet (40 mg total) by mouth daily. 04/01/18 04/01/19  Jacalyn LefevreHaviland, Satia Winger, MD  oxyCODONE-acetaminophen (PERCOCET) 5-325 MG per tablet Take 1 tablet by mouth every 6 (six) hours as needed for pain. 02/18/13   Palumbo, April, MD  predniSONE (STERAPRED UNI-PAK 21 TAB) 10 MG (21) TBPK tablet Take 6 tabs by mouth daily  for 2 days, then 5 tabs for 2 days, then 4 tabs for 2 days, then 3 tabs for 2 days, 2 tabs for 2 days, then 1 tab by mouth daily for 2 days 04/01/18   Jacalyn LefevreHaviland, Ruthetta Koopmann, MD  simvastatin (ZOCOR) 10 MG tablet Take 1 tablet (10 mg total) by mouth daily at 6 PM. 04/01/18  04/01/19  Jacalyn LefevreHaviland, Marice Angelino, MD    Family History Family History  Problem Relation Age of Onset  . Malignant hyperthermia Mother   . Hypotension Mother   . Hypotension Father   . Hypotension Sister   . Hypotension Brother     Social History Social History   Tobacco Use  . Smoking status: Current Every Day Smoker    Packs/day: 1.00    Years: 25.00    Pack years: 25.00    Types: Cigarettes  . Smokeless tobacco: Never Used  Substance Use Topics  . Alcohol use: No  . Drug use: No     Allergies   Patient has no known allergies.   Review of Systems Review of Systems  Musculoskeletal:       Left hip pain  All other systems reviewed and are negative.    Physical Exam Updated Vital Signs BP (!) 180/109   Pulse 99   Temp 98.5 F (36.9 C) (Oral)   Resp 16   Ht 5\' 3"  (1.6 m)   Wt 94.8 kg (209 lb)   SpO2 98%   BMI 37.02 kg/m   Physical Exam  Constitutional: She is oriented to person, place, and time. She appears well-developed and well-nourished.  HENT:  Head: Normocephalic and atraumatic.  Right  Ear: External ear normal.  Left Ear: External ear normal.  Nose: Nose normal.  Mouth/Throat: Oropharynx is clear and moist.  Eyes: Pupils are equal, round, and reactive to light. Conjunctivae and EOM are normal.  Neck: Normal range of motion. Neck supple.  Cardiovascular: Normal rate, regular rhythm, normal heart sounds and intact distal pulses.  Pulmonary/Chest: Effort normal and breath sounds normal.  Abdominal: Soft. Bowel sounds are normal.  Musculoskeletal:  + str leg raise on left  Neurological: She is alert and oriented to person, place, and time.  Skin: Skin is warm. Capillary refill takes less than 2 seconds.  Psychiatric: She has a normal mood and affect. Her behavior is normal. Judgment and thought content normal.  Nursing note and vitals reviewed.    ED Treatments / Results  Labs (all labs ordered are listed, but only abnormal results are  displayed) Labs Reviewed - No data to display  EKG None  Radiology No results found.  Procedures Procedures (including critical care time)  Medications Ordered in ED Medications  ketorolac (TORADOL) 30 MG/ML injection 30 mg (30 mg Intramuscular Given 04/01/18 1554)  dexamethasone (DECADRON) injection 10 mg (10 mg Intramuscular Given 04/01/18 1553)     Initial Impression / Assessment and Plan / ED Course  I have reviewed the triage vital signs and the nursing notes.  Pertinent labs & imaging results that were available during my care of the patient were reviewed by me and considered in my medical decision making (see chart for details).    Pt is feeling better.  Meds refilled.  Pt given number to the [facility] to establish primary care.  Return if worse.   Final Clinical Impressions(s) / ED Diagnoses   Final diagnoses:  Acute sciatica  Essential hypertension  Noncompliance with medications    ED Discharge Orders        Ordered    amLODipine (NORVASC) 10 MG tablet  Daily     04/01/18 1639    hydrochlorothiazide (HYDRODIURIL) 25 MG tablet  Daily     04/01/18 1639    lisinopril (PRINIVIL,ZESTRIL) 40 MG tablet  Daily     04/01/18 1639    simvastatin (ZOCOR) 10 MG tablet  Daily-1800     04/01/18 1639    predniSONE (STERAPRED UNI-PAK 21 TAB) 10 MG (21) TBPK tablet     04/01/18 1639    HYDROcodone-acetaminophen (NORCO/VICODIN) 5-325 MG tablet  Every 4 hours PRN     04/01/18 1639       Jacalyn Lefevre, MD 04/01/18 1640

## 2018-04-01 NOTE — ED Notes (Signed)
ED Provider at bedside. 

## 2018-04-01 NOTE — ED Triage Notes (Signed)
C/o pain to left hip/buttock area x today-denies injury-NAD-steady gait

## 2019-12-30 ENCOUNTER — Emergency Department (HOSPITAL_BASED_OUTPATIENT_CLINIC_OR_DEPARTMENT_OTHER): Payer: Self-pay

## 2019-12-30 ENCOUNTER — Emergency Department (HOSPITAL_BASED_OUTPATIENT_CLINIC_OR_DEPARTMENT_OTHER)
Admission: EM | Admit: 2019-12-30 | Discharge: 2019-12-30 | Disposition: A | Discharge status: Home / Self Care | Payer: Self-pay | Attending: Emergency Medicine | Admitting: Emergency Medicine

## 2019-12-30 ENCOUNTER — Encounter (HOSPITAL_BASED_OUTPATIENT_CLINIC_OR_DEPARTMENT_OTHER): Payer: Self-pay | Admitting: Emergency Medicine

## 2019-12-30 ENCOUNTER — Other Ambulatory Visit: Payer: Self-pay

## 2019-12-30 DIAGNOSIS — N390 Urinary tract infection, site not specified: Secondary | ICD-10-CM

## 2019-12-30 DIAGNOSIS — I10 Essential (primary) hypertension: Secondary | ICD-10-CM | POA: Insufficient documentation

## 2019-12-30 DIAGNOSIS — Z79899 Other long term (current) drug therapy: Secondary | ICD-10-CM | POA: Insufficient documentation

## 2019-12-30 DIAGNOSIS — F17210 Nicotine dependence, cigarettes, uncomplicated: Secondary | ICD-10-CM | POA: Insufficient documentation

## 2019-12-30 DIAGNOSIS — A599 Trichomoniasis, unspecified: Secondary | ICD-10-CM

## 2019-12-30 DIAGNOSIS — R1110 Vomiting, unspecified: Secondary | ICD-10-CM | POA: Insufficient documentation

## 2019-12-30 DIAGNOSIS — R1012 Left upper quadrant pain: Secondary | ICD-10-CM | POA: Insufficient documentation

## 2019-12-30 LAB — CBC WITH DIFFERENTIAL/PLATELET
Abs Immature Granulocytes: 0.04 10*3/uL (ref 0.00–0.07)
Basophils Absolute: 0.1 10*3/uL (ref 0.0–0.1)
Basophils Relative: 1 %
Eosinophils Absolute: 0.1 10*3/uL (ref 0.0–0.5)
Eosinophils Relative: 1 %
HCT: 45.3 % (ref 36.0–46.0)
Hemoglobin: 14.2 g/dL (ref 12.0–15.0)
Immature Granulocytes: 0 %
Lymphocytes Relative: 16 %
Lymphs Abs: 1.6 10*3/uL (ref 0.7–4.0)
MCH: 26.2 pg (ref 26.0–34.0)
MCHC: 31.3 g/dL (ref 30.0–36.0)
MCV: 83.7 fL (ref 80.0–100.0)
Monocytes Absolute: 0.4 10*3/uL (ref 0.1–1.0)
Monocytes Relative: 4 %
Neutro Abs: 7.8 10*3/uL — ABNORMAL HIGH (ref 1.7–7.7)
Neutrophils Relative %: 78 %
Platelets: 224 10*3/uL (ref 150–400)
RBC: 5.41 MIL/uL — ABNORMAL HIGH (ref 3.87–5.11)
RDW: 14.6 % (ref 11.5–15.5)
WBC: 10 10*3/uL (ref 4.0–10.5)
nRBC: 0 % (ref 0.0–0.2)

## 2019-12-30 LAB — COMPREHENSIVE METABOLIC PANEL
ALT: 14 U/L (ref 0–44)
AST: 23 U/L (ref 15–41)
Albumin: 4.3 g/dL (ref 3.5–5.0)
Alkaline Phosphatase: 66 U/L (ref 38–126)
Anion gap: 14 (ref 5–15)
BUN: 18 mg/dL (ref 8–23)
CO2: 23 mmol/L (ref 22–32)
Calcium: 9.8 mg/dL (ref 8.9–10.3)
Chloride: 101 mmol/L (ref 98–111)
Creatinine, Ser: 0.91 mg/dL (ref 0.44–1.00)
GFR calc Af Amer: >60 mL/min (ref >60)
GFR calc non Af Amer: >60 mL/min (ref >60)
Glucose, Bld: 114 mg/dL — ABNORMAL HIGH (ref 70–99)
Potassium: 4.5 mmol/L (ref 3.5–5.1)
Sodium: 138 mmol/L (ref 135–145)
Total Bilirubin: 0.6 mg/dL (ref 0.3–1.2)
Total Protein: 8.4 g/dL — ABNORMAL HIGH (ref 6.5–8.1)

## 2019-12-30 LAB — URINALYSIS, ROUTINE W REFLEX MICROSCOPIC
Bilirubin Urine: NEGATIVE
Glucose, UA: NEGATIVE mg/dL
Hgb urine dipstick: NEGATIVE
Ketones, ur: NEGATIVE mg/dL
Nitrite: NEGATIVE
Protein, ur: NEGATIVE mg/dL
Specific Gravity, Urine: 1.025 (ref 1.005–1.030)
pH: 6 (ref 5.0–8.0)

## 2019-12-30 LAB — URINALYSIS, MICROSCOPIC (REFLEX)

## 2019-12-30 LAB — LIPASE, BLOOD: Lipase: 32 U/L (ref 11–51)

## 2019-12-30 MED ORDER — ONDANSETRON 4 MG PO TBDP
ORAL_TABLET | ORAL | Status: AC
  Filled 2019-12-30: qty 1

## 2019-12-30 MED ORDER — KETOROLAC TROMETHAMINE 30 MG/ML IJ SOLN
15.0000 mg | Freq: Once | INTRAMUSCULAR | Status: AC
  Administered 2019-12-30: 15 mg via INTRAVENOUS

## 2019-12-30 MED ORDER — HYDROCODONE-ACETAMINOPHEN 5-325 MG PO TABS
1.0000 | ORAL_TABLET | Freq: Once | ORAL | Status: AC
  Administered 2019-12-30: 1 via ORAL
  Filled 2019-12-30: qty 1

## 2019-12-30 MED ORDER — SULFAMETHOXAZOLE-TRIMETHOPRIM 800-160 MG PO TABS: 1.0000 | ORAL_TABLET | Freq: Two times a day (BID) | ORAL | 0 refills | Status: AC

## 2019-12-30 MED ORDER — ONDANSETRON 4 MG PO TBDP
4.0000 mg | ORAL_TABLET | Freq: Once | ORAL | Status: AC
  Administered 2019-12-30: 4 mg via ORAL

## 2019-12-30 MED ORDER — SODIUM CHLORIDE 0.9 % IV BOLUS
1000.0000 mL | Freq: Once | INTRAVENOUS | Status: AC
  Administered 2019-12-30: 1000 mL via INTRAVENOUS

## 2019-12-30 MED ORDER — PROMETHAZINE HCL 25 MG/ML IJ SOLN
INTRAMUSCULAR | Status: AC
  Filled 2019-12-30: qty 1

## 2019-12-30 MED ORDER — IOHEXOL 300 MG/ML  SOLN
100.0000 mL | Freq: Once | INTRAMUSCULAR | Status: AC
  Administered 2019-12-30: 16:00:00 100 mL via INTRAVENOUS

## 2019-12-30 MED ORDER — MORPHINE SULFATE (PF) 4 MG/ML IV SOLN
4.0000 mg | Freq: Once | INTRAVENOUS | Status: AC
  Administered 2019-12-30: 4 mg via INTRAVENOUS
  Filled 2019-12-30: qty 1

## 2019-12-30 MED ORDER — METRONIDAZOLE 500 MG PO TABS
2000.0000 mg | ORAL_TABLET | Freq: Once | ORAL | Status: AC
  Administered 2019-12-30: 2000 mg via ORAL
  Filled 2019-12-30: qty 4

## 2019-12-30 MED ORDER — PROMETHAZINE HCL 25 MG/ML IJ SOLN
12.5000 mg | Freq: Once | INTRAMUSCULAR | Status: AC
  Administered 2019-12-30: 12.5 mg via INTRAVENOUS

## 2019-12-30 MED ORDER — ONDANSETRON 4 MG PO TBDP
4.0000 mg | ORAL_TABLET | Freq: Once | ORAL | Status: AC | PRN
  Administered 2019-12-30: 4 mg via ORAL
  Filled 2019-12-30: qty 1

## 2019-12-30 MED ORDER — KETOROLAC TROMETHAMINE 30 MG/ML IJ SOLN
15.0000 mg | Freq: Once | INTRAMUSCULAR | Status: DC
  Filled 2019-12-30: qty 1

## 2019-12-30 MED ORDER — ONDANSETRON 4 MG PO TBDP: 4.0000 mg | ORAL_TABLET | Freq: Three times a day (TID) | ORAL | 0 refills | Status: DC | PRN

## 2019-12-30 MED FILL — ONDANSETRON ODT 4 MG TABLET: 4 | 6 days supply | Qty: 20 | Fill #0

## 2019-12-30 MED FILL — SULFAMETHOXAZOLE-TMP DS TAB: 800-160 | 10 days supply | Qty: 20 | Fill #0

## 2019-12-30 NOTE — Discharge Instructions (Signed)
Please pick up antibiotics and take as prescribed. We have sent your urine for culture and you will receive a call in 2-3 days IF the antibiotic needs to be changed  We have treated you for trichomonas in the ED today as we picked it up in your urine. Please let all partners know you have been tested and treated.   Follow up with your PCP. If you do not have one please follow up with [facility] for your primary care needs.   Return to the ED for any worsening symptoms including worsening pain, excessive vomiting despite medications, fevers > 100.4 after 48 hours on antibiotics, holding onto urine, urinary or bowel incontinence, numbness in your groin, or any other concerning symptoms

## 2019-12-30 NOTE — ED Notes (Signed)
ED MD informed of pain 8/10

## 2019-12-30 NOTE — ED Provider Notes (Signed)
[facility] EMERGENCY DEPARTMENT Provider Note   CSN: [id number] Arrival date & time: 12/30/19  1157     History Chief Complaint  Patient presents with  . Back Pain    Michele Adams is a 59 y.o. female with PMHx HTN who presents to the ED today with complaint of gradual onset, constant, worsening, left lower back pain that started yesterday. No injury or increase in strenuous activity recently.  Ports the pain wraps around to her left upper quadrant.  When she woke up this morning she felt nauseated and had one episode of nonbloody nonbilious emesis at home.  While in the waiting room patient vomited 2 more times and then once again while she was in her room.  She was given Zofran without relief.  Patient denies any fevers or chills.  No urinary symptoms.  Nuys any urinary retention or urinary/bowel incontinence, saddle anesthesia, weakness or numbness in lower extremities, radiation of pain to lower extremities.   The history is provided by the patient and medical records.       Past Medical History:  Diagnosis Date  . Headache(784.0)   . Hypertension     Patient Active Problem List   Diagnosis Date Noted  . Tobacco abuse 09/21/2011  . Hypertensive emergency 09/21/2011    Past Surgical History:  Procedure Laterality Date  . TONSILLECTOMY       OB History   No obstetric history on file.     Family History  Problem Relation Age of Onset  . Malignant hyperthermia Mother   . Hypotension Mother   . Hypotension Father   . Hypotension Sister   . Hypotension Brother     Social History   Tobacco Use  . Smoking status: Current Every Day Smoker    Packs/day: 1.00    Years: 25.00    Pack years: 25.00    Types: Cigarettes  . Smokeless tobacco: Never Used  Substance Use Topics  . Alcohol use: No  . Drug use: No    Home Medications Prior to Admission medications   Medication Sig Start Date End Date Taking? Authorizing Provider  amLODipine (NORVASC)  10 MG tablet Take 1 tablet (10 mg total) by mouth daily. 04/01/18  Yes Jacalyn Lefevre, MD  hydrochlorothiazide (HYDRODIURIL) 25 MG tablet Take 1 tablet (25 mg total) by mouth daily. 04/01/18  Yes Jacalyn Lefevre, MD  HYDROcodone-acetaminophen (NORCO/VICODIN) 5-325 MG tablet Take 1 tablet by mouth every 4 (four) hours as needed. 04/01/18  Yes Jacalyn Lefevre, MD  lisinopril (PRINIVIL,ZESTRIL) 40 MG tablet Take 1 tablet (40 mg total) by mouth daily. 04/01/18 04/01/19  Jacalyn Lefevre, MD  ondansetron (ZOFRAN ODT) 4 MG disintegrating tablet Take 1 tablet (4 mg total) by mouth every 8 (eight) hours as needed for nausea or vomiting. 12/30/19   Tanda Rockers, PA-C  oxyCODONE-acetaminophen (PERCOCET) 5-325 MG per tablet Take 1 tablet by mouth every 6 (six) hours as needed for pain. 02/18/13   Palumbo, April, MD  predniSONE (STERAPRED UNI-PAK 21 TAB) 10 MG (21) TBPK tablet Take 6 tabs by mouth daily  for 2 days, then 5 tabs for 2 days, then 4 tabs for 2 days, then 3 tabs for 2 days, 2 tabs for 2 days, then 1 tab by mouth daily for 2 days 04/01/18   Jacalyn Lefevre, MD  simvastatin (ZOCOR) 10 MG tablet Take 1 tablet (10 mg total) by mouth daily at 6 PM. 04/01/18 04/01/19  Jacalyn Lefevre, MD  sulfamethoxazole-trimethoprim (BACTRIM DS) 800-160 MG tablet Take  1 tablet by mouth 2 (two) times daily for 10 days. 12/30/19 01/09/20  Tanda Rockers, PA-C    Allergies    Patient has no known allergies.  Review of Systems   Review of Systems  Constitutional: Negative for chills and fever.  Respiratory: Negative for cough and shortness of breath.   Cardiovascular: Negative for chest pain.  Gastrointestinal: Positive for abdominal pain, nausea and vomiting. Negative for constipation and diarrhea.  Musculoskeletal: Positive for back pain.  All other systems reviewed and are negative.   Physical Exam Updated Vital Signs BP (!) 171/97   Pulse (!) 107   Temp 98.1 F (36.7 C) (Oral)   Resp 20   Ht 5\' 3"  (1.6 m)   Wt  86.2 kg   SpO2 100%   BMI 33.66 kg/m   Physical Exam Vitals and nursing note reviewed.  Constitutional:      Appearance: She is not ill-appearing or diaphoretic.  HENT:     Head: Normocephalic and atraumatic.  Eyes:     Conjunctiva/sclera: Conjunctivae normal.  Cardiovascular:     Rate and Rhythm: Normal rate and regular rhythm.     Pulses: Normal pulses.  Pulmonary:     Effort: Pulmonary effort is normal.     Breath sounds: Normal breath sounds. No wheezing, rhonchi or rales.  Abdominal:     Palpations: Abdomen is soft.     Tenderness: There is abdominal tenderness. There is no right CVA tenderness, left CVA tenderness, guarding or rebound.     Comments: Soft, + left flank/LUQ abdominal TTP, +BS throughout, no r/g/r, neg murphy's, neg mcburney's, no CVA TTP  Musculoskeletal:     Cervical back: Neck supple.       Back:     Comments: No C, T, or L midline spinal tenderness. + Left paralumbar spinal TTP. Strength 5/5 to BLEs. Sensation intact throughout. Negative SLR bilaterally. 2+ DP and PT pulses.   Skin:    General: Skin is warm and dry.  Neurological:     Mental Status: She is alert.     ED Results / Procedures / Treatments   Labs (all labs ordered are listed, but only abnormal results are displayed) Labs Reviewed  URINALYSIS, ROUTINE W REFLEX MICROSCOPIC - Abnormal; Notable for the following components:      Result Value   Leukocytes,Ua MODERATE (*)    All other components within normal limits  URINALYSIS, MICROSCOPIC (REFLEX) - Abnormal; Notable for the following components:   Bacteria, UA RARE (*)    Trichomonas, UA PRESENT (*)    All other components within normal limits  COMPREHENSIVE METABOLIC PANEL - Abnormal; Notable for the following components:   Glucose, Bld 114 (*)    Total Protein 8.4 (*)    All other components within normal limits  CBC WITH DIFFERENTIAL/PLATELET - Abnormal; Notable for the following components:   RBC 5.41 (*)    Neutro Abs 7.8  (*)    All other components within normal limits  URINE CULTURE  LIPASE, BLOOD    EKG None  Radiology CT Abdomen Pelvis W Contrast  Result Date: 12/30/2019 CLINICAL DATA:  Abdominal pain, left upper quadrant pain and left back pain since yesterday EXAM: CT ABDOMEN AND PELVIS WITH CONTRAST TECHNIQUE: Multidetector CT imaging of the abdomen and pelvis was performed using the standard protocol following bolus administration of intravenous contrast. CONTRAST:  01/01/2020 OMNIPAQUE IOHEXOL 300 MG/ML  SOLN COMPARISON:  None. FINDINGS: Lower chest: No acute abnormality. Hepatobiliary: No solid liver abnormality is  seen. Hepatic steatosis. No gallstones, gallbladder wall thickening, or biliary dilatation. Pancreas: Unremarkable. No pancreatic ductal dilatation or surrounding inflammatory changes. Spleen: Normal in size without significant abnormality. Adrenals/Urinary Tract: Adrenal glands are unremarkable. Kidneys are normal, without renal calculi, solid lesion, or hydronephrosis. Bladder is unremarkable. Stomach/Bowel: Stomach is within normal limits. Appendix appears normal. No evidence of bowel wall thickening, distention, or inflammatory changes. Descending and sigmoid diverticulosis. Vascular/Lymphatic: Aortic atherosclerosis. No enlarged abdominal or pelvic lymph nodes. Reproductive: No mass or other significant abnormality. Other: No abdominal wall hernia or abnormality. No abdominopelvic ascites. Musculoskeletal: No acute or significant osseous findings. IMPRESSION: 1.  No acute CT findings of the abdomen or pelvis to explain pain. 2.  Hepatic steatosis. 3.  Diverticulosis without evidence of acute diverticulitis. 4.  Aortic Atherosclerosis (ICD10-I70.0). Electronically Signed   By: Eddie Candle M.D.   On: 12/30/2019 16:51    Procedures Procedures (including critical care time)  Medications Ordered in ED Medications  promethazine (PHENERGAN) 25 MG/ML injection (has no administration in time range)    metroNIDAZOLE (FLAGYL) tablet 2,000 mg (has no administration in time range)  HYDROcodone-acetaminophen (NORCO/VICODIN) 5-325 MG per tablet 1 tablet (1 tablet Oral Given 12/30/19 1334)  ondansetron (ZOFRAN-ODT) disintegrating tablet 4 mg (4 mg Oral Given 12/30/19 1338)  sodium chloride 0.9 % bolus 1,000 mL (0 mLs Intravenous Stopped 12/30/19 1555)  morphine 4 MG/ML injection 4 mg (4 mg Intravenous Given 12/30/19 1501)  promethazine (PHENERGAN) injection 12.5 mg (12.5 mg Intravenous Given 12/30/19 1500)  iohexol (OMNIPAQUE) 300 MG/ML solution 100 mL (100 mLs Intravenous Contrast Given 12/30/19 1604)  ketorolac (TORADOL) 30 MG/ML injection 15 mg (15 mg Intravenous Given 12/30/19 1726)    ED Course  I have reviewed the triage vital signs and the nursing notes.  Pertinent labs & imaging results that were available during my care of the patient were reviewed by me and considered in my medical decision making (see chart for details).    MDM Rules/Calculators/A&P                      59 year old female presents the ED today complaining of left lower back pain radiating to abdomen x2 days with nausea and nonbloody nonbilious emesis.  On arrival to the ED patient is afebrile, tachycardic in the 100s, nontachypneic.  She is laying on her stomach in the bed due to back pain.  She denies any urinary symptoms.  She does not have any specific CVA tenderness however is more tender in the left lumbar paraspinal area as well as left flank and left upper quadrant.  Patient denies heavy alcohol use.  He was given a Vicodin tablet and Zofran ODT in triage without relief of her pain or her nausea.  Patient actively vomiting in room.  Will give Phenergan and treat with morphine as I am more concerned about the pain radiating to the abdomen concerning for possible intra-abdominal etiology. Pt without hx of kidney stones however certainly in the differential. Will work up for abd pain with screening labs and reevaluate. Pt  without red flags concerning for cauda equina, spinal epidural abscess, or AAA.   U/A with moderate leuks, 6-10 WBCs per HPF, rare bacteria, and trichomonas. Pt denies sexual activity recently; reports she has not been sexually active in 5 years. Pt denies vaginal discharge or pelvic pain and does not want a pelvic exam. Will treat for trich once emesis is under control. Will send for culture.   CBC without leukocytosis. Hgb  stable.  CMP with glucose 114, no other electrolyte abnormalities. Creatinine 0.91. No elevation in LFTs. Lipase 32. No concern for pancreatitis causing pain at this time.   Pt continues to complain of pain despite norco, morphine, zofran, and phenergan. Given this will obtain CT scan to evaluate for pyelo vs stone vs other etiology causing symptoms   CT scan without acute findings to account for patient's pain. Pt has not vomited while in the ED. Will fluid challenge with flagyl to cover for trich. Pt still complains of pain, will give toradol. Given leuks in the urine and pain to flank with emesis will treat for pyelo today however none seen on CT scan. Pt to follow up with her PCP for reeavaluation. I would not suspect musculoskeletal pain to cause emesis.   Pt able to eat crackers without difficulty. Feel she is stable for discharge home. Urine culture sent; will call in 2-3 days if abx need to be changed. Pt advised to let all partners know to be tested and treated for trichomonas however she again denies intercourse in the past few years. Strict return precautions have been discussed. Pt is in agreement with plan and stable for discharge home.   This note was prepared using Dragon voice recognition software and may include unintentional dictation errors due to the inherent limitations of voice recognition software.  Final Clinical Impression(s) / ED Diagnoses Final diagnoses:  Lower urinary tract infectious disease  Trichomonas infection    Rx / DC Orders ED  Discharge Orders         Ordered    sulfamethoxazole-trimethoprim (BACTRIM DS) 800-160 MG tablet  2 times daily     12/30/19 1730    ondansetron (ZOFRAN ODT) 4 MG disintegrating tablet  Every 8 hours PRN     12/30/19 1730           Discharge Instructions     Please pick up antibiotics and take as prescribed. We have sent your urine for culture and you will receive a call in 2-3 days IF the antibiotic needs to be changed  We have treated you for trichomonas in the ED today as we picked it up in your urine. Please let all partners know you have been tested and treated.   Follow up with your PCP. If you do not have one please follow up with [facility] for your primary care needs.   Return to the ED for any worsening symptoms including worsening pain, excessive vomiting despite medications, fevers > 100.4 after 48 hours on antibiotics, holding onto urine, urinary or bowel incontinence, numbness in your groin, or any other concerning symptoms       Tanda Rockers, PA-C 12/30/19 1732    Little, Ambrose Finland, MD 12/30/19 [phone]

## 2019-12-30 NOTE — ED Triage Notes (Signed)
L back pain since yesterday. Vomited in triage.

## 2019-12-31 LAB — URINE CULTURE: Culture: <10000 — AB

## 2021-03-17 IMAGING — CT CT ABD-PELV W/ CM
2 of 5 series · 16 of 46 positions shown, 18 images · IV contrast (Omnipaque)
Comparison: None.

CLINICAL DATA: Abdominal pain, left upper quadrant pain and left
back pain since yesterday

EXAM:
CT ABDOMEN AND PELVIS WITH CONTRAST
TECHNIQUE: Multidetector CT imaging of the abdomen and pelvis was performed
using the standard protocol following bolus administration of
intravenous contrast.
CONTRAST:  100mL OMNIPAQUE IOHEXOL 300 MG/ML  SOLN

[Series 2: axial st · axial · 0.78mm/px · z∈[-366,+29]mm · 13 of 89 slices shown, 15 images]
[im 5/89  soft-tissue]
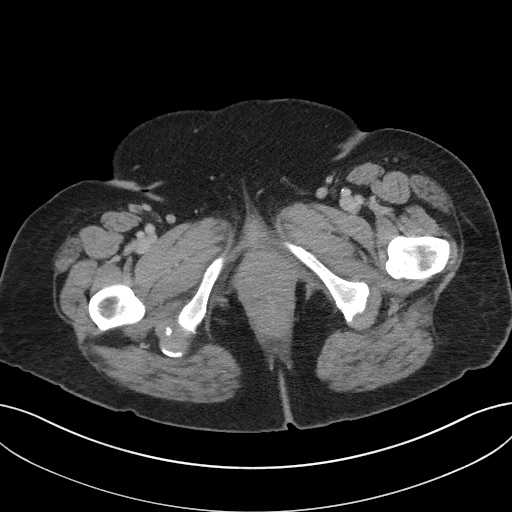
[im 5/89  bone]
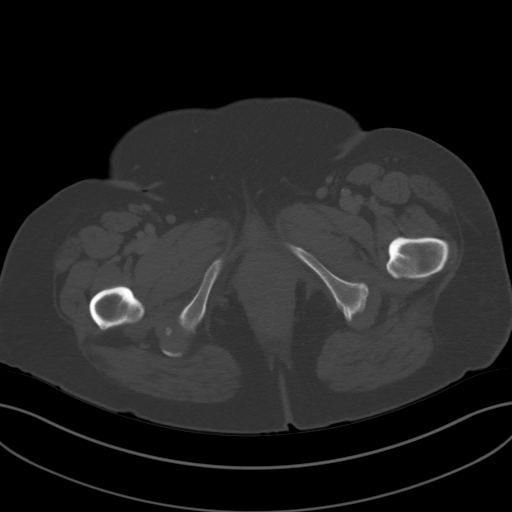
[im 14/89  soft-tissue]
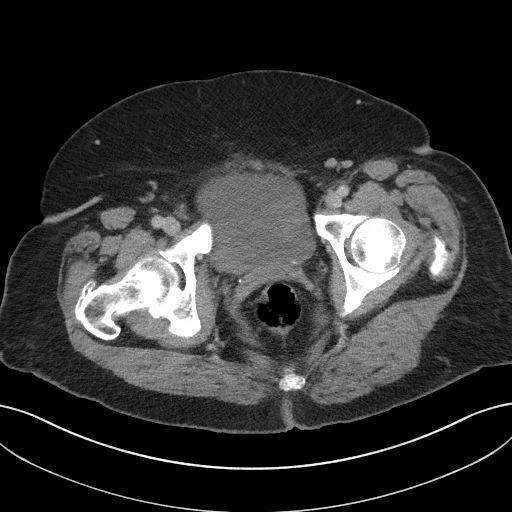
[im 19/89  soft-tissue]
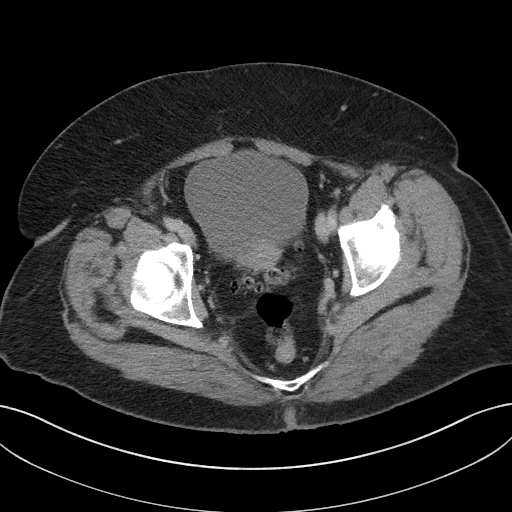
[im 24/89  soft-tissue]
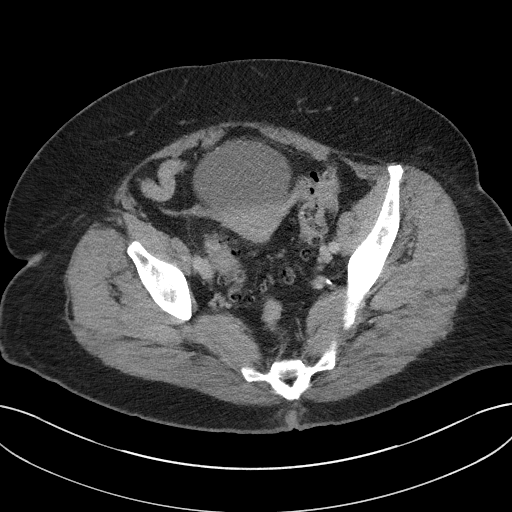
[im 33/89  soft-tissue]
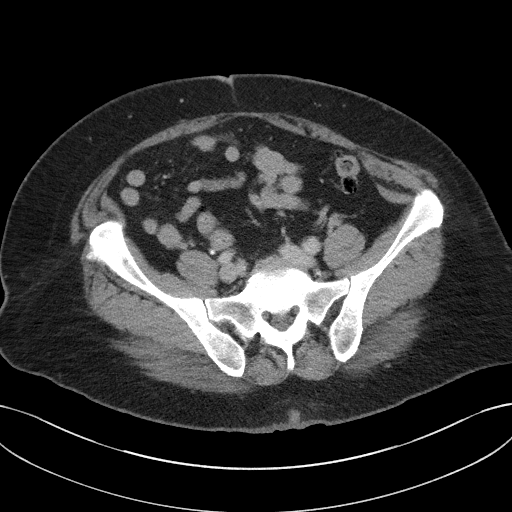
[im 38/89  soft-tissue]
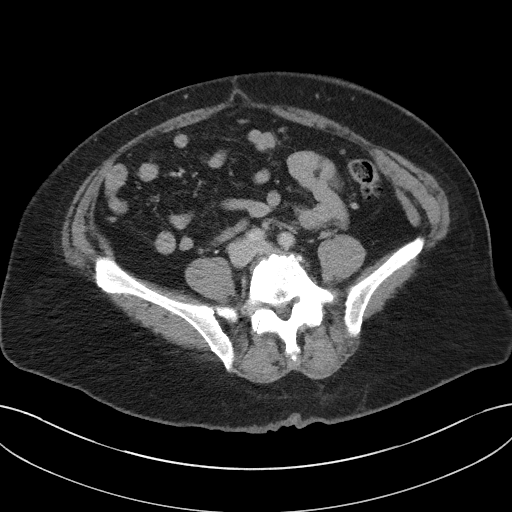
[im 47/89  soft-tissue]
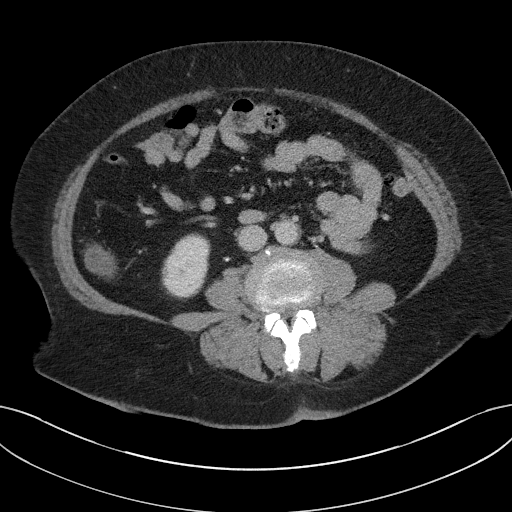
[im 51/89  soft-tissue]
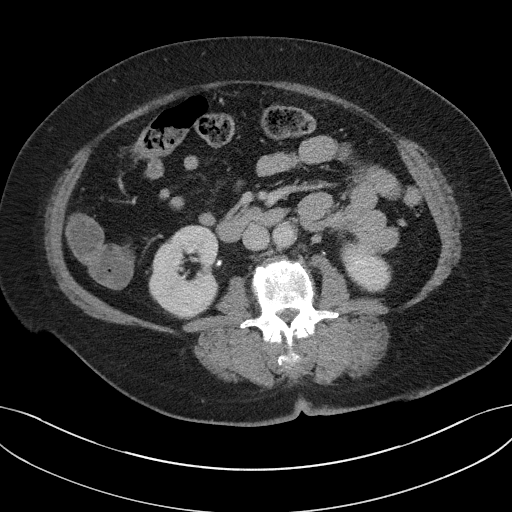
[im 56/89  soft-tissue]
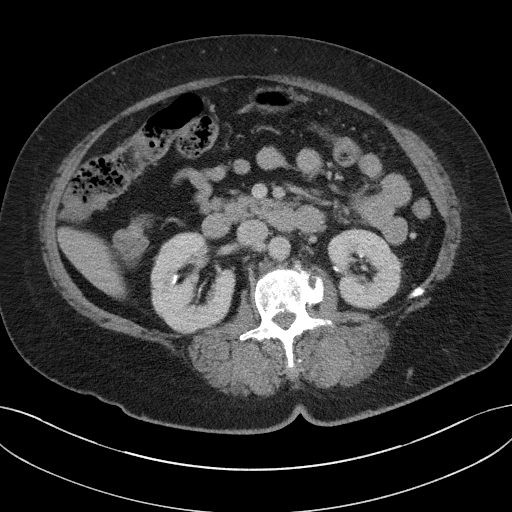
[im 56/89  bone]
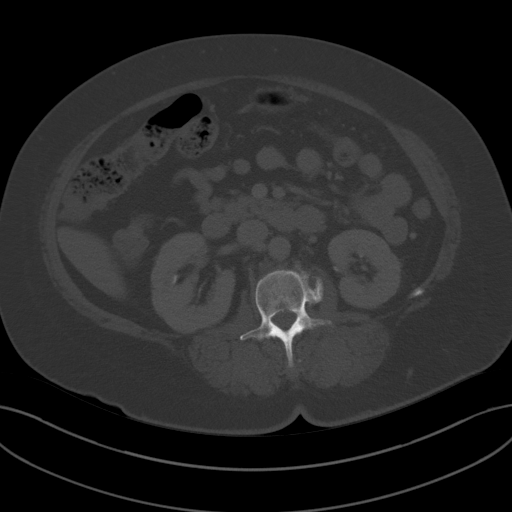
[im 65/89  soft-tissue]
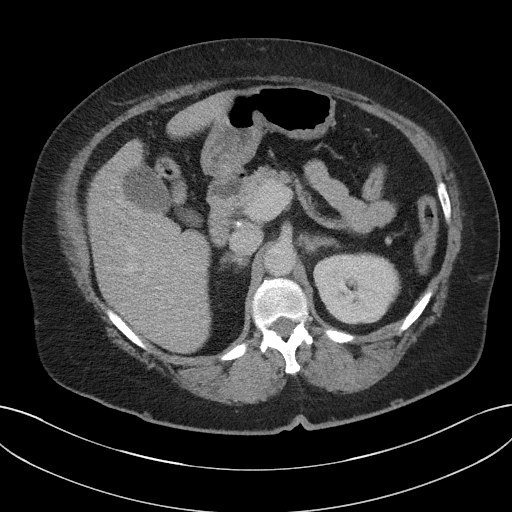
[im 70/89  soft-tissue]
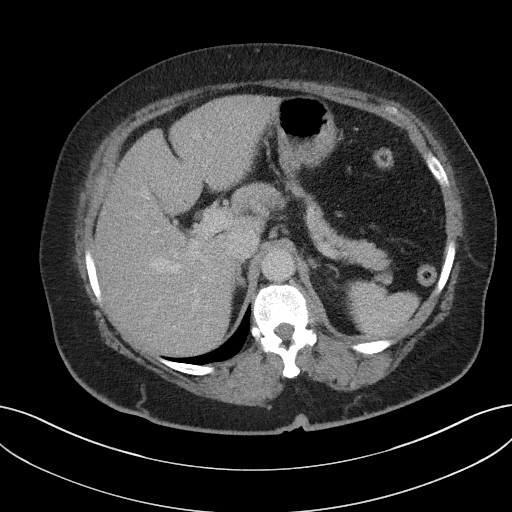
[im 75/89  soft-tissue]
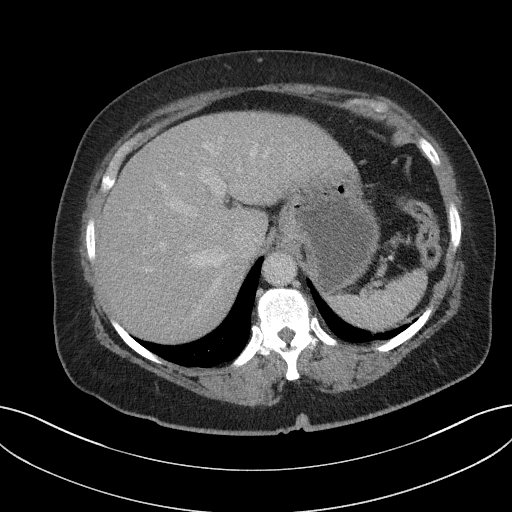
[im 84/89  soft-tissue]
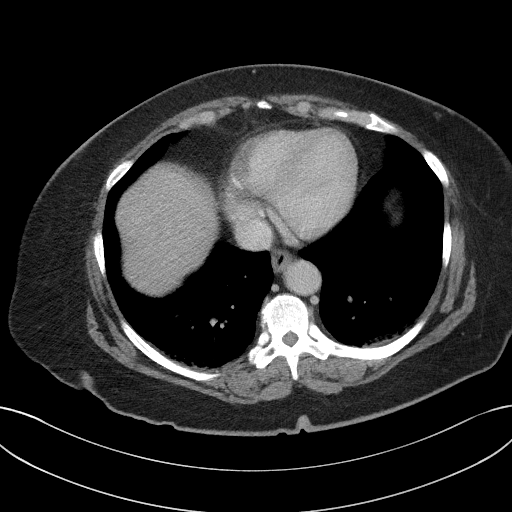

[Series 5: coronal st · coronal · 0.76mm/px · 3 of 108 slices shown]
[im 36/108  soft-tissue]
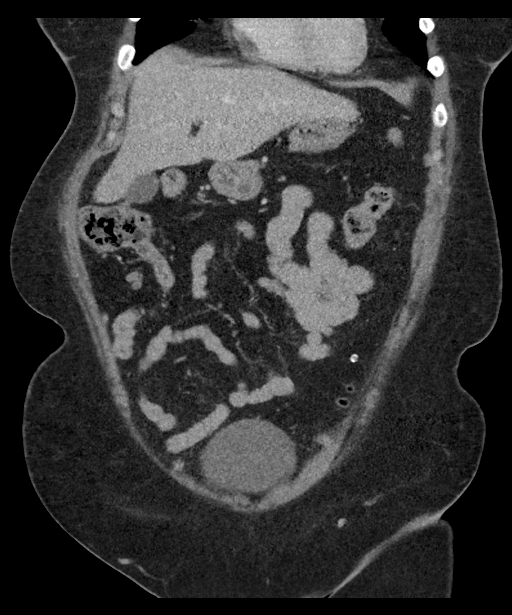
[im 48/108  soft-tissue]
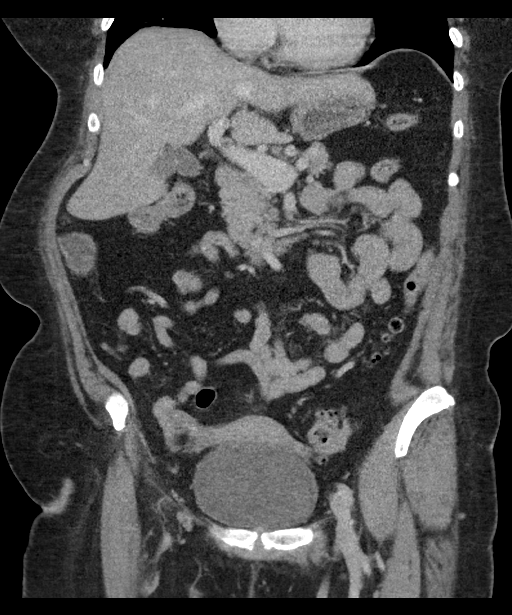
[im 60/108  soft-tissue]
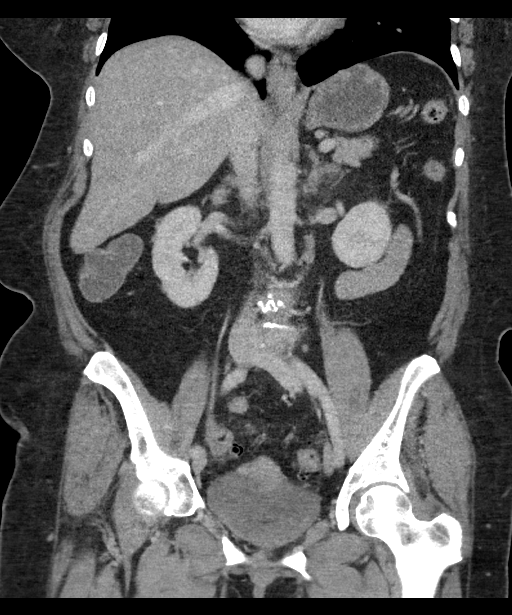

[16 of 46 positions shown; findings below may reference images not displayed]

FINDINGS: Lower chest: No acute abnormality.

Hepatobiliary: No solid liver abnormality is seen. Hepatic
steatosis. No gallstones, gallbladder wall thickening, or biliary
dilatation.

Pancreas: Unremarkable. No pancreatic ductal dilatation or
surrounding inflammatory changes.

Spleen: Normal in size without significant abnormality.

Adrenals/Urinary Tract: Adrenal glands are unremarkable. Kidneys are
normal, without renal calculi, solid lesion, or hydronephrosis.
Bladder is unremarkable.

Stomach/Bowel: Stomach is within normal limits. Appendix appears
normal. No evidence of bowel wall thickening, distention, or
inflammatory changes. Descending and sigmoid diverticulosis.

Vascular/Lymphatic: Aortic atherosclerosis. No enlarged abdominal or
pelvic lymph nodes.

Reproductive: No mass or other significant abnormality.

Other: No abdominal wall hernia or abnormality. No abdominopelvic
ascites.

Musculoskeletal: No acute or significant osseous findings.
IMPRESSION: 1.  No acute CT findings of the abdomen or pelvis to explain pain.

2.  Hepatic steatosis.

3.  Diverticulosis without evidence of acute diverticulitis.

4.  Aortic Atherosclerosis ([id number]).

## 2022-01-01 ENCOUNTER — Encounter (HOSPITAL_BASED_OUTPATIENT_CLINIC_OR_DEPARTMENT_OTHER): Payer: Self-pay

## 2022-01-01 ENCOUNTER — Emergency Department (HOSPITAL_BASED_OUTPATIENT_CLINIC_OR_DEPARTMENT_OTHER)
Admission: EM | Admit: 2022-01-01 | Discharge: 2022-01-01 | Disposition: A | Discharge status: Home / Self Care | Payer: Self-pay | Attending: Emergency Medicine | Admitting: Emergency Medicine

## 2022-01-01 ENCOUNTER — Other Ambulatory Visit: Payer: Self-pay

## 2022-01-01 DIAGNOSIS — I10 Essential (primary) hypertension: Secondary | ICD-10-CM | POA: Insufficient documentation

## 2022-01-01 DIAGNOSIS — R000 Tachycardia, unspecified: Secondary | ICD-10-CM | POA: Insufficient documentation

## 2022-01-01 DIAGNOSIS — H538 Other visual disturbances: Secondary | ICD-10-CM | POA: Insufficient documentation

## 2022-01-01 LAB — CBG MONITORING, ED: Glucose-Capillary: 70 mg/dL (ref 70–99)

## 2022-01-01 NOTE — ED Triage Notes (Signed)
Pt arrives ambulatory to ED with reports of headache yesterday and blurred vision for 3 days. States the blurred vision has gotten worse today.  ?

## 2022-01-01 NOTE — Discharge Instructions (Signed)
Overall suspect that you have a primary eye issue.  Follow-up with ophthalmology for good dilated eye exam. ?

## 2022-01-01 NOTE — ED Provider Notes (Signed)
?[facility] EMERGENCY DEPARTMENT ?Provider Note ? ? ?CSN: [id number] ?Arrival date & time: 01/01/22  9563 ? ?  ? ?History ? ?Chief Complaint  ?Patient presents with  ? Eye Problem  ? ? ?Michele Adams is a 61 y.o. female. ? ?The history is provided by the patient.  ?Eye Problem ?Location:  Both eyes ?Quality: blurred vision last 3 days. ?Severity:  Mild ?Onset quality:  Gradual ?Timing:  Intermittent ?Progression:  Waxing and waning ?Chronicity:  New ?Context: not burn, not chemical exposure, not contact lens problem, not direct trauma, not foreign body, not using machinery, not scratch, not smoke exposure and not UV exposure   ?Relieved by:  Nothing ?Worsened by:  Nothing ?Associated symptoms: blurred vision and headaches (mild one yesterday but none today)   ?Associated symptoms: no crusting, no decreased vision, no discharge, no double vision, no facial rash, no inflammation, no itching, no nausea, no numbness, no photophobia, no redness, no scotomas, no swelling, no tearing, no tingling, no vomiting and no weakness   ? ?  ? ?Home Medications ?Prior to Admission medications   ?Medication Sig Start Date End Date Taking? Authorizing Provider  ?amLODipine (NORVASC) 10 MG tablet Take 1 tablet (10 mg total) by mouth daily. 04/01/18   Jacalyn Lefevre, MD  ?hydrochlorothiazide (HYDRODIURIL) 25 MG tablet Take 1 tablet (25 mg total) by mouth daily. 04/01/18   Jacalyn Lefevre, MD  ?lisinopril (PRINIVIL,ZESTRIL) 40 MG tablet Take 1 tablet (40 mg total) by mouth daily. 04/01/18 04/01/19  Jacalyn Lefevre, MD  ?simvastatin (ZOCOR) 10 MG tablet Take 1 tablet (10 mg total) by mouth daily at 6 PM. 04/01/18 04/01/19  Jacalyn Lefevre, MD  ?   ? ?Allergies    ?Patient has no known allergies.   ? ?Review of Systems   ?Review of Systems  ?Eyes:  Positive for blurred vision. Negative for double vision, photophobia, discharge, redness and itching.  ?Gastrointestinal:  Negative for nausea and vomiting.  ?Neurological:  Positive for  headaches (mild one yesterday but none today). Negative for tingling, weakness and numbness.  ? ?Physical Exam ?Updated Vital Signs ?BP (!) 155/86 (BP Location: Right Arm)   Pulse (!) 102 Comment: Simultaneous filing. User may not have seen previous data.  Temp 98.2 ?F (36.8 ?C) (Oral)   Resp 16 Comment: Simultaneous filing. User may not have seen previous data.  Ht 5\' 3"  (1.6 m)   Wt 86.2 kg   SpO2 100%   BMI 33.66 kg/m?  ?Physical Exam ?Vitals and nursing note reviewed.  ?Constitutional:   ?   General: She is not in acute distress. ?   Appearance: She is well-developed.  ?HENT:  ?   Head: Normocephalic and atraumatic.  ?Eyes:  ?   General: No visual field deficit. ?   Extraocular Movements: Extraocular movements intact.  ?   Right eye: Normal extraocular motion and no nystagmus.  ?   Left eye: Normal extraocular motion and no nystagmus.  ?   Conjunctiva/sclera: Conjunctivae normal.  ?   Pupils: Pupils are equal, round, and reactive to light.  ?   Right eye: Pupil is round and reactive.  ?   Left eye: Pupil is round and reactive.  ?   Comments: 20/20 vision bilaterally with readers, no nystagmus, no visual field loss, no double vision, normal extraocular motion, no inflammation or swelling of the conjunctiva or surrounding eye  ?Cardiovascular:  ?   Rate and Rhythm: Normal rate and regular rhythm.  ?   Heart sounds: Normal  heart sounds. No murmur heard. ?Pulmonary:  ?   Effort: Pulmonary effort is normal. No respiratory distress.  ?   Breath sounds: Normal breath sounds.  ?Abdominal:  ?   Palpations: Abdomen is soft.  ?   Tenderness: There is no abdominal tenderness.  ?Musculoskeletal:     ?   General: No swelling.  ?   Cervical back: Neck supple.  ?Skin: ?   General: Skin is warm and dry.  ?   Capillary Refill: Capillary refill takes less than 2 seconds.  ?Neurological:  ?   Mental Status: She is alert and oriented to person, place, and time.  ?   Cranial Nerves: No cranial nerve deficit, dysarthria or  facial asymmetry.  ?   Sensory: No sensory deficit.  ?   Motor: No weakness.  ?   Coordination: Coordination normal.  ?   Gait: Gait normal.  ?Psychiatric:     ?   Mood and Affect: Mood normal.  ? ? ?ED Results / Procedures / Treatments   ?Labs ?(all labs ordered are listed, but only abnormal results are displayed) ?Labs Reviewed - No data to display ? ?EKG ?EKG Interpretation ? ?Date/Time:  Tuesday January 01 2022 10:04:29 EDT ?Ventricular Rate:  100 ?PR Interval:  148 ?QRS Duration: 121 ?QT Interval:  374 ?QTC Calculation: 483 ?R Axis:   -77 ?Text Interpretation: Sinus tachycardia Atrial premature complex Left bundle branch block Confirmed by Lockie Mola, Danali Marinos ([id number]) on 01/01/2022 10:09:33 AM ? ?Radiology ?No results found. ? ?Procedures ?Procedures  ? ? ?Medications Ordered in ED ?Medications - No data to display ? ?ED Course/ Medical Decision Making/ A&P ?  ?                        ?Medical Decision Making ? ?Michele Adams is here for blurred vision.  History of headache and hypertension.  Had a mild headache yesterday but none currently.  Overall describes some trouble focusing at times.  No visual field loss.  No headache, no nausea, no vomiting.  No weakness or numbness.  She has 20/20 vision bilaterally in her eyes.  Normal extraocular movements.  Does not have any ocular pain.  I have no concern for glaucoma or other acute eye process at this time.  Have no concern for stroke.  She does wear readers and she states when she is on her phone she feels like it is hard to see at times and describes some intermittent blurred vision.  Overall she has no vision loss.  I have no concern for retinal detachment or vitreous humor hemorrhage but overall believe she needs a dilated eye exam at some point with eye doctor.  Probably needs corrective lenses.  Could be cataracts.  Overall no emergent process at this time and will have her follow-up with ophthalmology.  Discharged in good condition. ? ?This chart was dictated  using voice recognition software.  Despite best efforts to proofread,  errors can occur which can change the documentation meaning.  ? ? ? ? ? ? ? ?Final Clinical Impression(s) / ED Diagnoses ?Final diagnoses:  ?Blurred vision, bilateral  ? ? ?Rx / DC Orders ?ED Discharge Orders   ? ? None  ? ?  ? ? ?  ?Virgina Norfolk, DO ?01/01/22 1024 ? ?
